# Patient Record
Sex: Female | Born: 1950 | Race: Black or African American | Hispanic: No | Marital: Married | Smoking: Never smoker
Health system: Southern US, Community
[De-identification: ages and names within clinical notes are randomized; demographics above are authoritative.]

## PROBLEM LIST (undated history)

## (undated) DIAGNOSIS — N289 Disorder of kidney and ureter, unspecified: Secondary | ICD-10-CM

## (undated) DIAGNOSIS — E119 Type 2 diabetes mellitus without complications: Secondary | ICD-10-CM

## (undated) DIAGNOSIS — I10 Essential (primary) hypertension: Secondary | ICD-10-CM

## (undated) HISTORY — PX: ABDOMINAL HYSTERECTOMY: SHX81

## (undated) HISTORY — PX: CHOLECYSTECTOMY: SHX55

## (undated) HISTORY — PX: BACK SURGERY: SHX140

---

## 2020-06-30 ENCOUNTER — Emergency Department
Admission: EM | Admit: 2020-06-30 | Discharge: 2020-06-30 | Disposition: A | Discharge status: Home / Self Care | Payer: Medicare HMO | Attending: Student in an Organized Health Care Education/Training Program | Admitting: Student in an Organized Health Care Education/Training Program

## 2020-06-30 ENCOUNTER — Emergency Department: Payer: Medicare HMO

## 2020-06-30 ENCOUNTER — Other Ambulatory Visit: Payer: Self-pay

## 2020-06-30 DIAGNOSIS — E119 Type 2 diabetes mellitus without complications: Secondary | ICD-10-CM | POA: Insufficient documentation

## 2020-06-30 DIAGNOSIS — S60221A Contusion of right hand, initial encounter: Secondary | ICD-10-CM | POA: Diagnosis not present

## 2020-06-30 DIAGNOSIS — M542 Cervicalgia: Secondary | ICD-10-CM | POA: Insufficient documentation

## 2020-06-30 DIAGNOSIS — S6991XA Unspecified injury of right wrist, hand and finger(s), initial encounter: Secondary | ICD-10-CM | POA: Diagnosis present

## 2020-06-30 DIAGNOSIS — S0990XA Unspecified injury of head, initial encounter: Secondary | ICD-10-CM | POA: Insufficient documentation

## 2020-06-30 DIAGNOSIS — I10 Essential (primary) hypertension: Secondary | ICD-10-CM | POA: Insufficient documentation

## 2020-06-30 DIAGNOSIS — Y92410 Unspecified street and highway as the place of occurrence of the external cause: Secondary | ICD-10-CM | POA: Diagnosis not present

## 2020-06-30 HISTORY — DX: Essential (primary) hypertension: I10

## 2020-06-30 HISTORY — DX: Disorder of kidney and ureter, unspecified: N28.9

## 2020-06-30 HISTORY — DX: Type 2 diabetes mellitus without complications: E11.9

## 2020-06-30 MED ORDER — ALPRAZOLAM 0.5 MG PO TABS
0.5000 mg | ORAL_TABLET | Freq: Once | ORAL | Status: AC
  Administered 2020-06-30: 0.5 mg via ORAL
  Filled 2020-06-30: qty 1

## 2020-06-30 MED ORDER — CYCLOBENZAPRINE HCL 5 MG PO TABS: 5.0000 mg | ORAL_TABLET | Freq: Three times a day (TID) | ORAL | 0 refills | Status: DC | PRN

## 2020-06-30 MED ORDER — ACETAMINOPHEN 325 MG PO TABS
650.0000 mg | ORAL_TABLET | Freq: Once | ORAL | Status: AC
  Administered 2020-06-30: 650 mg via ORAL
  Filled 2020-06-30: qty 2
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
## 2020-06-30 NOTE — ED Notes (Signed)
Patient assisted with redressing. Patient ambulatory without difficulty, with a steady gait. Patient walked to her husband's room down the hall. Patient verbalized understanding about discharge instructions.

## 2020-06-30 NOTE — ED Notes (Signed)
Patient transported to X-ray 

## 2020-06-30 NOTE — Discharge Instructions (Signed)
Your exam, CTs, and x-rays are negative and reassuring at this time.  No acute findings on exam.  You did have an incidental finding, of a small thyroid nodule.  This will need to be followed up via your primary care provider with an outpatient ultrasound.  Take over-the-counter Tylenol and Motrin as needed for pain relief.  You may take the prescription muscle relaxant as needed.  Follow-up with your primary provider as discussed.
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
## 2020-06-30 NOTE — ED Notes (Signed)
Patient transported to CT 

## 2020-12-28 DIAGNOSIS — I272 Pulmonary hypertension, unspecified: Secondary | ICD-10-CM | POA: Diagnosis present

## 2022-03-03 ENCOUNTER — Other Ambulatory Visit: Payer: Self-pay

## 2022-03-03 ENCOUNTER — Ambulatory Visit
Admission: RE | Admit: 2022-03-03 | Discharge: 2022-03-03 | Disposition: A | Discharge status: Home / Self Care | Payer: Medicare HMO | Source: Ambulatory Visit | Attending: Internal Medicine | Admitting: Internal Medicine

## 2022-03-03 DIAGNOSIS — R079 Chest pain, unspecified: Secondary | ICD-10-CM

## 2022-10-15 ENCOUNTER — Ambulatory Visit
Admission: RE | Admit: 2022-10-15 | Discharge: 2022-10-15 | Disposition: A | Discharge status: Home / Self Care | Payer: Medicare HMO | Source: Ambulatory Visit | Attending: Nephrology | Admitting: Nephrology

## 2022-10-15 ENCOUNTER — Other Ambulatory Visit: Payer: Self-pay | Admitting: Nephrology

## 2022-10-15 ENCOUNTER — Ambulatory Visit
Admission: RE | Admit: 2022-10-15 | Discharge: 2022-10-15 | Disposition: A | Discharge status: Home / Self Care | Payer: Medicare HMO | Attending: Nephrology | Admitting: Nephrology

## 2022-10-15 DIAGNOSIS — N186 End stage renal disease: Secondary | ICD-10-CM

## 2022-10-15 DIAGNOSIS — R0602 Shortness of breath: Secondary | ICD-10-CM | POA: Diagnosis present

## 2022-10-15 DIAGNOSIS — Z992 Dependence on renal dialysis: Secondary | ICD-10-CM | POA: Diagnosis present

## 2022-10-22 ENCOUNTER — Other Ambulatory Visit (INDEPENDENT_AMBULATORY_CARE_PROVIDER_SITE_OTHER): Payer: Medicare HMO

## 2022-10-22 ENCOUNTER — Encounter (INDEPENDENT_AMBULATORY_CARE_PROVIDER_SITE_OTHER): Payer: Medicare HMO | Admitting: Nurse Practitioner

## 2022-10-22 ENCOUNTER — Encounter (INDEPENDENT_AMBULATORY_CARE_PROVIDER_SITE_OTHER): Payer: Medicare HMO
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
## 2022-11-24 DIAGNOSIS — D631 Anemia in chronic kidney disease: Secondary | ICD-10-CM | POA: Diagnosis present

## 2023-02-03 ENCOUNTER — Encounter: Payer: Self-pay | Admitting: Urology

## 2023-02-03 ENCOUNTER — Ambulatory Visit: Payer: Medicare HMO | Admitting: Urology

## 2023-02-03 VITALS — BP 184/93 | HR 71 | Ht 63.0 in | Wt 202.0 lb

## 2023-02-03 DIAGNOSIS — R319 Hematuria, unspecified: Secondary | ICD-10-CM | POA: Diagnosis not present

## 2023-02-03 LAB — MICROSCOPIC EXAMINATION: WBC, UA: >30 /[HPF] — AB (ref 0–5)

## 2023-02-03 LAB — URINALYSIS, COMPLETE
Bilirubin, UA: NEGATIVE
Ketones, UA: NEGATIVE
Nitrite, UA: NEGATIVE
Specific Gravity, UA: 1.025 (ref 1.005–1.030)
Urobilinogen, Ur: 0.2 mg/dL (ref 0.2–1.0)
pH, UA: 5.5 (ref 5.0–7.5)

## 2023-02-03 LAB — BLADDER SCAN AMB NON-IMAGING: PVR: 14 WU

## 2023-02-03 NOTE — Progress Notes (Signed)
02/03/2023 12:07 AM   [name] [birth year] [id number]  Referring provider: Mosetta Pigeon, MD [address] [facility] [address] [place],  [place] [address]  Chief Complaint  Patient presents with   Hematuria    HPI: 72 year-old female presents today for further evaluation of hematuria and urinary urgency.  She has a personal history of end stage renal disease and recently had a PD catheter placed.   Her urinalysis is frankly positive. It has greater than 30 WBC's, 3-10 RBC's, nitrate negative, many bacteria.  She reports seeing blood in her urine last month, even some clots. She also had burning. Dr. Thedore Mins didn't send a culture. He called her in antibiotics and her symptoms have resolved.   She started dialysis around July 20. She does still make a lot of urine.   She has not had a UTI before. No history of tobacco use.   Results for orders placed or performed in visit on 02/03/23  Microscopic Examination   Urine  Result Value Ref Range   WBC, UA >30 (A) 0 - 5 /hpf   RBC, Urine 3-10 (A) 0 - 2 /hpf   Epithelial Cells (non renal) 0-10 0 - 10 /hpf   Casts Present (A) None seen /lpf   Cast Type Granular casts (A) N/A   Mucus, UA Present (A) Not Estab.   Bacteria, UA Many (A) None seen/Few  Urinalysis, Complete  Result Value Ref Range   Specific Gravity, UA 1.025 1.005 - 1.030   pH, UA 5.5 5.0 - 7.5   Color, UA Yellow Yellow   Appearance Ur Hazy (A) Clear   Leukocytes,UA Trace (A) Negative   Protein,UA 3+ (A) Negative/Trace   Glucose, UA 1+ (A) Negative   Ketones, UA Negative Negative   RBC, UA 2+ (A) Negative   Bilirubin, UA Negative Negative   Urobilinogen, Ur 0.2 0.2 - 1.0 mg/dL   Nitrite, UA Negative Negative   Microscopic Examination See below:   Bladder Scan (Post Void Residual) in office  Result Value Ref Range   PVR 14.0 WU    PMH: Past Medical History:  Diagnosis Date   Diabetes mellitus without complication (HCC)    Hypertension    Renal disorder      Surgical History: Past Surgical History:  Procedure Laterality Date   ABDOMINAL HYSTERECTOMY     BACK SURGERY     CHOLECYSTECTOMY      Home Medications:  Allergies as of 02/03/2023       Reactions   Amlodipine Other (See Comments)   Leg Swelling   Escitalopram Other (See Comments)   weakness Weakness weakness    Weakness   Hydralazine Other (See Comments)   Chest pain   Oxycodone Other (See Comments)   Aspirin    Hydrocodone         Medication List        Accurate as of February 03, 2023 11:59 PM. If you have any questions, ask your nurse or doctor.          albuterol 108 (90 Base) MCG/ACT inhaler Commonly known as: VENTOLIN HFA Inhale into the lungs.   atorvastatin 20 MG tablet Commonly known as: LIPITOR Take by mouth.   Lipitor 10 MG tablet Generic drug: atorvastatin Take by mouth.   BD Pen Needle Nano U/F 32G X 4 MM Misc Generic drug: Insulin Pen Needle Use to inject insulin 4 times daily   BD Pen Needle Nano 2nd Gen 32G X 4 MM Misc Generic drug: Insulin  Pen Needle USE 4 TIMES DAILY AS DIRECTED   carvedilol 25 MG tablet Commonly known as: COREG Take by mouth.   cyclobenzaprine 5 MG tablet Commonly known as: FLEXERIL Take 1 tablet (5 mg total) by mouth 3 (three) times daily as needed.   Dexcom G7 Receiver Hardie Pulley See admin instructions.   Dexcom G7 Sensor Misc Use 1 Device every 10 (ten) days   ergocalciferol 1.25 MG (50000 UT) capsule Commonly known as: VITAMIN D2 Take by mouth.   insulin lispro 100 UNIT/ML KwikPen Commonly known as: HUMALOG Breakfast 8 units,  Dinner 12 units   irbesartan 150 MG tablet Commonly known as: AVAPRO Take 150 mg by mouth daily.   Lantus SoloStar 100 UNIT/ML Solostar Pen Generic drug: insulin glargine Inject into the skin.   omeprazole 20 MG capsule Commonly known as: PRILOSEC Take 20 mg by mouth daily.   pregabalin 25 MG capsule Commonly known as: LYRICA Take 25 mg by mouth 2 (two) times  daily.   spironolactone 25 MG tablet Commonly known as: ALDACTONE Take 25 mg by mouth daily.   torsemide 100 MG tablet Commonly known as: DEMADEX Take 100 mg by mouth daily.        Allergies:  Allergies  Allergen Reactions   Amlodipine Other (See Comments)    Leg Swelling   Escitalopram Other (See Comments)    weakness  Weakness  weakness    Weakness   Hydralazine Other (See Comments)    Chest pain   Oxycodone Other (See Comments)   Aspirin    Hydrocodone     Social History:  reports that she has never smoked. She has never used smokeless tobacco. She reports that she does not currently use alcohol. No history on file for drug use.   Physical Exam: BP (!) 184/93   Pulse 71   Ht 5\' 3"  (1.6 m)   Wt 202 lb (91.6 kg)   BMI 35.78 kg/m   Constitutional:  Alert and oriented, No acute distress. HEENT:  AT, moist mucus membranes.  Trachea midline, no masses. Neurologic: Grossly intact, no focal deficits, moving all 4 extremities. Psychiatric: Normal mood and affect.  Urinalysis    Component Value Date/Time   APPEARANCEUR Hazy (A) 02/03/2023 1510   GLUCOSEU 1+ (A) 02/03/2023 1510   BILIRUBINUR Negative 02/03/2023 1510   PROTEINUR 3+ (A) 02/03/2023 1510   NITRITE Negative 02/03/2023 1510   LEUKOCYTESUR Trace (A) 02/03/2023 1510    Lab Results  Component Value Date   LABMICR See below: 02/03/2023   WBCUA >30 (A) 02/03/2023   LABEPIT 0-10 02/03/2023   MUCUS Present (A) 02/03/2023   BACTERIA Many (A) 02/03/2023     Assessment & Plan:    1. Gross hematuria/ possible acute cystitis  -  It's unclear whether or not this is related to something infectious. Her urine does appear potentially infected today. If there's not infection, she'll need a hematuria workup.  -UCx today, treated as needed -Will reevaluate with a repeat UA in a month.  -If there's still microscopic blood present she has agreed to CT scan and cystoscopy.  Return in about 1 month (around  03/05/2023) for UA.  I have reviewed the above documentation for accuracy and completeness, and I agree with the above.   Vanna Scotland, MD    [facility] [address], [address] [place], [place] [address] [phone]

## 2023-02-03 NOTE — Progress Notes (Incomplete)
02/03/2023 11:57 PM   Helen Gray 15-Jan-1951 [id number]  Referring provider: Mosetta Pigeon, MD [address] [facility] [address] [place],  [place] [address]  Chief Complaint  Patient presents with  . Hematuria    HPI: 72 year-old female presents today for further evaluation of hematuria and urinary urgency.  She has a personal history of end stage renal disease and recently had a PD catheter placed.   Her urinalysis is frankly positive. It has greater than 30 WBC's, 3-10 RBC's, nitrate negative, many bacteria.  She reports seeing blood in her urine last month, even some clots. She also had burning. Dr. Thedore Mins didn't send a culture and called her in antibiotics and her symptoms have resolved.   She started dialysis around July 20. She does still make a lot of urine.   She has not had a UTI before. No history of tobacco use.   Results for orders placed or performed in visit on 02/03/23  Microscopic Examination   Urine  Result Value Ref Range   WBC, UA >30 (A) 0 - 5 /hpf   RBC, Urine 3-10 (A) 0 - 2 /hpf   Epithelial Cells (non renal) 0-10 0 - 10 /hpf   Casts Present (A) None seen /lpf   Cast Type Granular casts (A) N/A   Mucus, UA Present (A) Not Estab.   Bacteria, UA Many (A) None seen/Few  Urinalysis, Complete  Result Value Ref Range   Specific Gravity, UA 1.025 1.005 - 1.030   pH, UA 5.5 5.0 - 7.5   Color, UA Yellow Yellow   Appearance Ur Hazy (A) Clear   Leukocytes,UA Trace (A) Negative   Protein,UA 3+ (A) Negative/Trace   Glucose, UA 1+ (A) Negative   Ketones, UA Negative Negative   RBC, UA 2+ (A) Negative   Bilirubin, UA Negative Negative   Urobilinogen, Ur 0.2 0.2 - 1.0 mg/dL   Nitrite, UA Negative Negative   Microscopic Examination See below:   Bladder Scan (Post Void Residual) in office  Result Value Ref Range   PVR 14.0 WU    PMH: Past Medical History:  Diagnosis Date  . Diabetes mellitus without complication (HCC)   . Hypertension   . Renal disorder      Surgical History: Past Surgical History:  Procedure Laterality Date  . ABDOMINAL HYSTERECTOMY    . BACK SURGERY    . CHOLECYSTECTOMY      Home Medications:  Allergies as of 02/03/2023       Reactions   Amlodipine Other (See Comments)   Leg Swelling   Escitalopram Other (See Comments)   weakness Weakness weakness    Weakness   Hydralazine Other (See Comments)   Chest pain   Oxycodone Other (See Comments)   Aspirin    Hydrocodone         Medication List        Accurate as of February 03, 2023 11:57 PM. If you have any questions, ask your nurse or doctor.          albuterol 108 (90 Base) MCG/ACT inhaler Commonly known as: VENTOLIN HFA Inhale into the lungs.   atorvastatin 20 MG tablet Commonly known as: LIPITOR Take by mouth.   Lipitor 10 MG tablet Generic drug: atorvastatin Take by mouth.   BD Pen Needle Nano U/F 32G X 4 MM Misc Generic drug: Insulin Pen Needle Use to inject insulin 4 times daily   BD Pen Needle Nano 2nd Gen 32G X 4 MM Misc Generic drug: Insulin  Pen Needle USE 4 TIMES DAILY AS DIRECTED   carvedilol 25 MG tablet Commonly known as: COREG Take by mouth.   cyclobenzaprine 5 MG tablet Commonly known as: FLEXERIL Take 1 tablet (5 mg total) by mouth 3 (three) times daily as needed.   Dexcom G7 Receiver Hardie Pulley See admin instructions.   Dexcom G7 Sensor Misc Use 1 Device every 10 (ten) days   ergocalciferol 1.25 MG (50000 UT) capsule Commonly known as: VITAMIN D2 Take by mouth.   insulin lispro 100 UNIT/ML KwikPen Commonly known as: HUMALOG Breakfast 8 units,  Dinner 12 units   irbesartan 150 MG tablet Commonly known as: AVAPRO Take 150 mg by mouth daily.   Lantus SoloStar 100 UNIT/ML Solostar Pen Generic drug: insulin glargine Inject into the skin.   omeprazole 20 MG capsule Commonly known as: PRILOSEC Take 20 mg by mouth daily.   pregabalin 25 MG capsule Commonly known as: LYRICA Take 25 mg by mouth 2 (two) times  daily.   spironolactone 25 MG tablet Commonly known as: ALDACTONE Take 25 mg by mouth daily.   torsemide 100 MG tablet Commonly known as: DEMADEX Take 100 mg by mouth daily.        Allergies:  Allergies  Allergen Reactions  . Amlodipine Other (See Comments)    Leg Swelling  . Escitalopram Other (See Comments)    weakness  Weakness  weakness    Weakness  . Hydralazine Other (See Comments)    Chest pain  . Oxycodone Other (See Comments)  . Aspirin   . Hydrocodone     Social History:  reports that she has never smoked. She has never used smokeless tobacco. She reports that she does not currently use alcohol. No history on file for drug use.   Physical Exam: BP (!) 184/93   Pulse 71   Ht 5\' 3"  (1.6 m)   Wt 202 lb (91.6 kg)   BMI 35.78 kg/m   Constitutional:  Alert and oriented, No acute distress. HEENT: [place] AT, moist mucus membranes.  Trachea midline, no masses. Neurologic: Grossly intact, no focal deficits, moving all 4 extremities. Psychiatric: Normal mood and affect.  Urinalysis    Component Value Date/Time   APPEARANCEUR Hazy (A) 02/03/2023 1510   GLUCOSEU 1+ (A) 02/03/2023 1510   BILIRUBINUR Negative 02/03/2023 1510   PROTEINUR 3+ (A) 02/03/2023 1510   NITRITE Negative 02/03/2023 1510   LEUKOCYTESUR Trace (A) 02/03/2023 1510    Lab Results  Component Value Date   LABMICR See below: 02/03/2023   WBCUA >30 (A) 02/03/2023   LABEPIT 0-10 02/03/2023   MUCUS Present (A) 02/03/2023   BACTERIA Many (A) 02/03/2023     Assessment & Plan:    1. Gross hematuria/ possible acute cystitis  -   No follow-ups on file.  Amy Allegra Grana  [facility] [address], [address] [place], [place] [address] [phone]

## 2023-02-06 ENCOUNTER — Other Ambulatory Visit: Payer: Self-pay

## 2023-02-06 ENCOUNTER — Telehealth: Payer: Self-pay

## 2023-02-06 LAB — CULTURE, URINE COMPREHENSIVE

## 2023-02-06 MED ORDER — CEPHALEXIN 250 MG PO CAPS: 250.0000 mg | ORAL_CAPSULE | Freq: Every day | ORAL | 0 refills | Status: AC

## 2023-02-06 NOTE — Telephone Encounter (Signed)
Pt is calling requesting u/c results.   Pls advise.

## 2023-02-06 NOTE — Telephone Encounter (Signed)
LVM for pt to return call, to inform them of antibiotic sent.

## 2023-02-06 NOTE — Telephone Encounter (Signed)
Please send in Keflex 250 mg daily x 5 days.

## 2023-02-09 ENCOUNTER — Other Ambulatory Visit: Payer: Self-pay

## 2023-02-09 MED ORDER — AMOXICILLIN-POT CLAVULANATE 250-125 MG PO TABS: 1.0000 | ORAL_TABLET | Freq: Two times a day (BID) | ORAL | 0 refills | Status: DC

## 2023-02-09 NOTE — Telephone Encounter (Signed)
Pt informed, voiced understanding

## 2023-02-09 NOTE — Telephone Encounter (Signed)
Pt would like to know what type of infection she has.  I read message about abx being sent in.

## 2023-03-03 ENCOUNTER — Encounter: Payer: Self-pay | Admitting: Physician Assistant

## 2023-03-03 ENCOUNTER — Ambulatory Visit: Payer: Medicare HMO | Admitting: Physician Assistant

## 2023-03-03 VITALS — BP 176/90 | HR 76

## 2023-03-03 DIAGNOSIS — R3121 Asymptomatic microscopic hematuria: Secondary | ICD-10-CM

## 2023-03-03 LAB — MICROSCOPIC EXAMINATION: WBC, UA: >30 /[HPF] — AB (ref 0–5)

## 2023-03-03 LAB — URINALYSIS, COMPLETE
Bilirubin, UA: NEGATIVE
Ketones, UA: NEGATIVE
Nitrite, UA: NEGATIVE
Specific Gravity, UA: 1.025 (ref 1.005–1.030)
Urobilinogen, Ur: 0.2 mg/dL (ref 0.2–1.0)
pH, UA: 6 (ref 5.0–7.5)

## 2023-03-03 NOTE — Progress Notes (Signed)
03/03/2023 2:40 PM   Helen Gray 1950/10/02 [id number]  CC: Chief Complaint  Patient presents with   Follow-up   HPI: Helen Gray is a 72 y.o. female with PMH ESRD on PD with a recent history of gross hematuria possibly related to UTI who presents today for urine recheck.  She is accompanied today by her husband, who contributes to HPI.   Today she reports her gross hematuria resolved after antibiotics per Dr. Thedore Gray.  We treated her with 5 days of Keflex for Klebsiella UTI after her visit with Dr. Apolinar Gray last month.  She denies dysuria but reports some occasional urgency.  In-office UA today positive for 1+ glucose, 1+ blood, 3+ protein, and trace leukocytes; urine microscopy with >30 WBCs/HPF, 3-10 RBCs/HPF, and many bacteria.  PMH: Past Medical History:  Diagnosis Date   Diabetes mellitus without complication (HCC)    Hypertension    Renal disorder     Surgical History: Past Surgical History:  Procedure Laterality Date   ABDOMINAL HYSTERECTOMY     BACK SURGERY     CHOLECYSTECTOMY      Home Medications:  Allergies as of 03/03/2023       Reactions   Amlodipine Other (See Comments)   Leg Swelling   Escitalopram Other (See Comments)   weakness Weakness weakness    Weakness   Gabapentin Other (See Comments)   headache   Hydralazine Other (See Comments)   Chest pain   Oxycodone Other (See Comments)   Aspirin    Codeine    Hydrocodone         Medication List        Accurate as of March 03, 2023  2:40 PM. If you have any questions, ask your nurse or doctor.          albuterol 108 (90 Base) MCG/ACT inhaler Commonly known as: VENTOLIN HFA Inhale into the lungs.   amoxicillin-clavulanate 250-125 MG tablet Commonly known as: AUGMENTIN Take 1 tablet by mouth 2 (two) times daily.   atorvastatin 20 MG tablet Commonly known as: LIPITOR Take by mouth.   Lipitor 10 MG tablet Generic drug: atorvastatin Take by mouth.   BD Pen Needle Nano U/F 32G X 4  MM Misc Generic drug: Insulin Pen Needle Use to inject insulin 4 times daily   BD Pen Needle Nano 2nd Gen 32G X 4 MM Misc Generic drug: Insulin Pen Needle USE 4 TIMES DAILY AS DIRECTED   carvedilol 25 MG tablet Commonly known as: COREG Take by mouth.   cyclobenzaprine 5 MG tablet Commonly known as: FLEXERIL Take 1 tablet (5 mg total) by mouth 3 (three) times daily as needed.   Dexcom G7 Receiver Helen Gray See admin instructions.   Dexcom G7 Sensor Misc Use 1 Device every 10 (ten) days   ergocalciferol 1.25 MG (50000 UT) capsule Commonly known as: VITAMIN D2 Take by mouth.   insulin lispro 100 UNIT/ML KwikPen Commonly known as: HUMALOG Breakfast 8 units,  Dinner 12 units   irbesartan 150 MG tablet Commonly known as: AVAPRO Take 150 mg by mouth daily.   Lantus SoloStar 100 UNIT/ML Solostar Pen Generic drug: insulin glargine Inject into the skin.   multivitamin tablet Take 1 tablet by mouth daily.   omeprazole 20 MG capsule Commonly known as: PRILOSEC Take 20 mg by mouth daily.   potassium chloride SA 20 MEQ tablet Commonly known as: KLOR-CON M Take 20 mEq by mouth 2 (two) times daily.   pregabalin 25 MG capsule Commonly known as:  LYRICA Take 25 mg by mouth 2 (two) times daily.   spironolactone 25 MG tablet Commonly known as: ALDACTONE Take 25 mg by mouth daily.   torsemide 100 MG tablet Commonly known as: DEMADEX Take 100 mg by mouth daily.        Allergies:  Allergies  Allergen Reactions   Amlodipine Other (See Comments)    Leg Swelling   Escitalopram Other (See Comments)    weakness  Weakness  weakness    Weakness   Gabapentin Other (See Comments)    headache   Hydralazine Other (See Comments)    Chest pain   Oxycodone Other (See Comments)   Aspirin    Codeine    Hydrocodone     Family History: No family history on file.  Social History:   reports that she has never smoked. She has never used smokeless tobacco. She reports that  she does not currently use alcohol. No history on file for drug use.  Physical Exam: BP (!) 176/90   Pulse 76   Constitutional:  Alert and oriented, no acute distress, nontoxic appearing HEENT: Helen Gray, AT Cardiovascular: No clubbing, cyanosis, or edema Respiratory: Normal respiratory effort, no increased work of breathing Skin: No rashes, bruises or suspicious lesions Neurologic: Grossly intact, no focal deficits, moving all 4 extremities Psychiatric: Normal mood and affect  Laboratory Data: Results for orders placed or performed in visit on 03/03/23  Microscopic Examination   Urine  Result Value Ref Range   WBC, UA >30 (A) 0 - 5 /hpf   RBC, Urine 3-10 (A) 0 - 2 /hpf   Epithelial Cells (non renal) 0-10 0 - 10 /hpf   Bacteria, UA Many (A) None seen/Few  Urinalysis, Complete  Result Value Ref Range   Specific Gravity, UA 1.025 1.005 - 1.030   pH, UA 6.0 5.0 - 7.5   Color, UA Yellow Yellow   Appearance Ur Hazy (A) Clear   Leukocytes,UA Trace (A) Negative   Protein,UA 3+ (A) Negative/Trace   Glucose, UA 1+ (A) Negative   Ketones, UA Negative Negative   RBC, UA 1+ (A) Negative   Bilirubin, UA Negative Negative   Urobilinogen, Ur 0.2 0.2 - 1.0 mg/dL   Nitrite, UA Negative Negative   Microscopic Examination See below:    Assessment & Plan:   1. 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  That said, we will pursue hematuria workup in light of her ongoing microscopic hematuria.  We discussed with the micro heme may be related to colonization, however important to pursue CT scan and cystoscopy to rule out other malignant causes.  Will obtain CT stone study due to ESRD.  She is in agreement with this plan.  Repeat urine culture today but defer antibiotics given that she is asymptomatic.  She will require antibiotics leading up to cystoscopy next month. - Urinalysis,  Complete - CULTURE, URINE COMPREHENSIVE - CT RENAL STONE STUDY; Future  Return in about 4 weeks (around 03/31/2023) for Cysto and CT results with Dr. Apolinar Gray.  Helen Ching, PA-C  [facility] [place] [address] [place], [place] [address] [phone]

## 2023-03-03 NOTE — Patient Instructions (Signed)

## 2023-03-06 LAB — CULTURE, URINE COMPREHENSIVE

## 2023-03-17 ENCOUNTER — Ambulatory Visit
Admission: RE | Admit: 2023-03-17 | Discharge: 2023-03-17 | Disposition: A | Discharge status: Home / Self Care | Payer: Medicare HMO | Source: Ambulatory Visit | Attending: Physician Assistant | Admitting: Physician Assistant

## 2023-03-17 DIAGNOSIS — R3121 Asymptomatic microscopic hematuria: Secondary | ICD-10-CM | POA: Insufficient documentation

## 2023-04-09 ENCOUNTER — Telehealth: Payer: Self-pay

## 2023-04-09 MED ORDER — CEFUROXIME AXETIL 250 MG PO TABS: 250.0000 mg | ORAL_TABLET | Freq: Two times a day (BID) | ORAL | 0 refills | Status: DC

## 2023-04-09 NOTE — Telephone Encounter (Signed)
-----   Message from [facility] sent at 04/09/2023  5:07 PM EST ----- Please have her start cefuroxime 250mg  BID x5 days 3 days prior to upcoming cystoscopy to prevent UTI.

## 2023-04-21 ENCOUNTER — Other Ambulatory Visit: Payer: Medicare HMO | Admitting: Urology

## 2023-04-22 ENCOUNTER — Telehealth: Payer: Self-pay | Admitting: Urology

## 2023-04-22 NOTE — Telephone Encounter (Signed)
No other openings right now, will let Dr Apolinar Junes review for recommendations

## 2023-04-22 NOTE — Telephone Encounter (Signed)
Patient called wanting to see if she can be seen before 2/18 for her cysto. She states that her urine has a blood smell to it and when she stands for long periods of time( I.e. when she is washing dishes) her left side starts hurting, also sometimes she gets sharp pains that happen in her lower left abdomen. She would like a call back to discuss what she needs to do?

## 2023-04-23 NOTE — Telephone Encounter (Signed)
Patient advised.

## 2023-04-23 NOTE — Telephone Encounter (Signed)
CT scan is normal in terms of her genitourinary tract.  I do not think any thing that can explain the left-sided pain in the lower abdomen.  This could be GI or musculoskeletal.  I would follow-up with PCP.    In terms of cystoscopy, we can certainly add her to a waiting list but this, while very important, will not lnecessarily give her an answer to her specific question.  Vanna Scotland, MD

## 2023-05-19 ENCOUNTER — Ambulatory Visit: Payer: Medicare HMO | Admitting: Urology

## 2023-05-19 VITALS — BP 183/82 | HR 80

## 2023-05-19 DIAGNOSIS — R310 Gross hematuria: Secondary | ICD-10-CM

## 2023-05-19 DIAGNOSIS — R350 Frequency of micturition: Secondary | ICD-10-CM

## 2023-05-19 LAB — URINALYSIS, COMPLETE
Bilirubin, UA: NEGATIVE
Glucose, UA: NEGATIVE
Ketones, UA: NEGATIVE
Leukocytes,UA: NEGATIVE
Nitrite, UA: NEGATIVE
RBC, UA: NEGATIVE
Specific Gravity, UA: <1.005 — ABNORMAL LOW (ref 1.005–1.030)
Urobilinogen, Ur: 0.2 mg/dL (ref 0.2–1.0)
pH, UA: 5.5 (ref 5.0–7.5)

## 2023-05-19 LAB — MICROSCOPIC EXAMINATION

## 2023-05-19 NOTE — Progress Notes (Signed)
   05/19/23  CC:  Chief Complaint  Patient presents with   Cysto    HPI: Female with a personal history of gross hematuria related to UTI who presents today for cystoscopy.  Patient presents for details.  Today, her urine is unremarkable other than for procedure  She does complain of urgency frequency and urge incontinence.  She does take 100 mg of Lasix every day.  NED. A&Ox3.   No respiratory distress   Abd soft, NT, ND Normal external genitalia with patent urethral meatus  Cystoscopy Procedure Note  Patient identification was confirmed, informed consent was obtained, and patient was prepped using Betadine solution.  Lidocaine jelly was administered per urethral meatus.    Procedure: - Flexible cystoscope introduced, without any difficulty.   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    no stones    no ulcers     no tumors    no urethral polyps    no trabeculation  - Ureteral orifices were normal in position and appearance.  Post-Procedure: - Patient tolerated the procedure well  Assessment/ Plan:  1. Gross hematuria (Primary) Likely related to previous infectious process  Some mild erythematous changes today at most consistent with inflammatory related change i.e. cystitis cystica  Not enough urine today for cytology but will have her follow-up for this to ensure that there is no underlying pathology, low suspicion - Urinalysis, Complete  2. Urinary frequency Bothersome exacerbated by Lasix  May benefit from anticholinergic versus beta 3 agonist.  Will have her follow-up to discuss this further. - Urinalysis, Complete    Follow-up to discuss urinary frequency/urge incontinence and urine cytology  Vanna Scotland, MD

## 2023-06-03 ENCOUNTER — Encounter: Payer: Self-pay | Admitting: Physician Assistant

## 2023-06-03 ENCOUNTER — Ambulatory Visit: Payer: Medicare HMO | Admitting: Physician Assistant

## 2023-06-03 VITALS — BP 131/68 | HR 82 | Resp 16 | Ht 63.0 in | Wt 201.8 lb

## 2023-06-03 DIAGNOSIS — R310 Gross hematuria: Secondary | ICD-10-CM

## 2023-06-03 DIAGNOSIS — R350 Frequency of micturition: Secondary | ICD-10-CM

## 2023-06-03 NOTE — Progress Notes (Signed)
 06/03/2023 1:59 PM   [name] 07/21/1950 [id number]  CC: Chief Complaint  Patient presents with   Abdominal Pain   urine frequency   Dysuria   HPI: Helen Gray is a 73 y.o. 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  She is accompanied today by her husband.  Today she reports she voided just prior to her appointment today and is unable to provide a urine specimen.  She has been having urgency and urge incontinence since her cystoscopy a couple of weeks ago.  PMH: Past Medical History:  Diagnosis Date   Diabetes mellitus without complication (HCC)    Hypertension    Renal disorder     Surgical History: Past Surgical History:  Procedure Laterality Date   ABDOMINAL HYSTERECTOMY     BACK SURGERY     CHOLECYSTECTOMY      Home Medications:  Allergies as of 06/03/2023       Reactions   Amlodipine Other (See Comments)   Leg Swelling   Escitalopram Other (See Comments)   weakness Weakness weakness    Weakness   Gabapentin Other (See Comments)   headache   Hydralazine Other (See Comments)   Chest pain   Oxycodone Other (See Comments)   Aspirin    Codeine    Hydrocodone         Medication List        Accurate as of June 03, 2023  1:59 PM. If you have any questions, ask your nurse or doctor.          albuterol 108 (90 Base) MCG/ACT inhaler Commonly known as: VENTOLIN HFA Inhale into the lungs.   amoxicillin-clavulanate 250-125 MG tablet Commonly known as: AUGMENTIN Take 1 tablet by mouth 2 (two) times daily.   atorvastatin 20 MG tablet Commonly known as: LIPITOR Take by mouth.   Lipitor 10 MG tablet Generic drug: atorvastatin Take by mouth.   BD Pen Needle Nano U/F 32G X 4 MM Misc Generic drug: Insulin Pen Needle Use to inject insulin 4 times daily   BD Pen Needle Nano 2nd Gen 32G X 4 MM  Misc Generic drug: Insulin Pen Needle USE 4 TIMES DAILY AS DIRECTED   carvedilol 25 MG tablet Commonly known as: COREG Take by mouth.   cefUROXime 250 MG tablet Commonly known as: CEFTIN Take 1 tablet (250 mg total) by mouth 2 (two) times daily with a meal. Please start 3 days prior to upcoming cystoscopy appointment to prevent UTI.   cyclobenzaprine 5 MG tablet Commonly known as: FLEXERIL Take 1 tablet (5 mg total) by mouth 3 (three) times daily as needed.   Dexcom G7 Receiver Hardie Pulley See admin instructions.   Dexcom G7 Sensor Misc Use 1 Device every 10 (ten) days   ergocalciferol 1.25 MG (50000 UT) capsule Commonly known as: VITAMIN D2 Take by mouth.   escitalopram 10 MG tablet Commonly known as: LEXAPRO Take 10 mg by mouth daily.   insulin lispro 100 UNIT/ML KwikPen Commonly known as: HUMALOG Breakfast 8 units,  Dinner 12 units   irbesartan 150 MG tablet Commonly known as: AVAPRO Take 150 mg by mouth daily.   Lantus SoloStar 100 UNIT/ML Solostar Pen Generic drug: insulin glargine Inject into the skin.   magnesium 30 MG tablet Take 30 mg by mouth 2 (two) times daily.   multivitamin tablet Take 1 tablet  by mouth daily.   omeprazole 20 MG capsule Commonly known as: PRILOSEC Take 20 mg by mouth daily.   potassium chloride SA 20 MEQ tablet Commonly known as: KLOR-CON M Take 20 mEq by mouth 2 (two) times daily.   pregabalin 25 MG capsule Commonly known as: LYRICA Take 25 mg by mouth 2 (two) times daily.   spironolactone 25 MG tablet Commonly known as: ALDACTONE Take 25 mg by mouth daily.   torsemide 100 MG tablet Commonly known as: DEMADEX Take 100 mg by mouth daily.        Allergies:  Allergies  Allergen Reactions   Amlodipine Other (See Comments)    Leg Swelling   Escitalopram Other (See Comments)    weakness  Weakness  weakness    Weakness   Gabapentin Other (See Comments)    headache   Hydralazine Other (See Comments)    Chest pain    Oxycodone Other (See Comments)   Aspirin    Codeine    Hydrocodone     Family History: No family history on file.  Social History:   reports that she has never smoked. She has never used smokeless tobacco. She reports that she does not currently use alcohol. No history on file for drug use.  Physical Exam: BP 131/68   Pulse 82   Resp 16   Ht 5\' 3"  (1.6 m)   Wt 201 lb 12.8 oz (91.5 kg)   BMI 35.75 kg/m   Constitutional:  Alert and oriented, no acute distress, nontoxic appearing HEENT: [place], AT Cardiovascular: No clubbing, cyanosis, or edema Respiratory: Normal respiratory effort, no increased work of breathing Skin: No rashes, bruises or suspicious lesions Neurologic: Grossly intact, no focal deficits, moving all 4 extremities Psychiatric: Normal mood and affect  In and Out Catheterization  Patient is present today for a I & O catheterization due to hematuria. Patient was cleaned and prepped in a sterile fashion with betadine . A 14FR cath was inserted no complications were noted , <101ml of urine return was noted, urine was yellow in color. Catheter was removed with out difficulty.    Performed by: Carman Ching, PA-C   Assessment & Plan:   1. Gross hematuria (Primary) Unable to void a specimen today.  I catheter but was unable to collect sufficient urine for planned cytology.  I gave her a urine cup to take home and asked her to drop off a urine specimen at her convenience.  Will plan for UA and cytology. - Urinalysis, Complete; Future   2. Urinary frequency Urgency, frequency, and urge incontinence since cystoscopy.  Need to rule out infection per UA as above.  Once ruled out, okay to start a trial of beta 3 agonists.  Return for Will call with results.  Carman Ching, PA-C  [facility] [place] [address] [place], [place] [address] [phone]

## 2023-06-04 ENCOUNTER — Other Ambulatory Visit: Payer: Self-pay

## 2023-06-04 DIAGNOSIS — R310 Gross hematuria: Secondary | ICD-10-CM

## 2023-06-04 LAB — MICROSCOPIC EXAMINATION: WBC, UA: >30 /HPF — AB (ref 0–5)

## 2023-06-04 LAB — URINALYSIS, COMPLETE
Bilirubin, UA: NEGATIVE
Glucose, UA: NEGATIVE
Ketones, UA: NEGATIVE
Nitrite, UA: NEGATIVE
Specific Gravity, UA: 1.025 (ref 1.005–1.030)
Urobilinogen, Ur: 0.2 mg/dL (ref 0.2–1.0)
pH, UA: 6.5 (ref 5.0–7.5)

## 2023-06-12 ENCOUNTER — Ambulatory Visit (INDEPENDENT_AMBULATORY_CARE_PROVIDER_SITE_OTHER): Admitting: Physician Assistant

## 2023-06-12 ENCOUNTER — Encounter: Payer: Self-pay | Admitting: Physician Assistant

## 2023-06-12 VITALS — BP 132/78 | HR 76 | Ht 64.0 in | Wt 200.4 lb

## 2023-06-12 DIAGNOSIS — N3000 Acute cystitis without hematuria: Secondary | ICD-10-CM | POA: Diagnosis not present

## 2023-06-12 MED ORDER — TRIMETHOPRIM 100 MG PO TABS: 100.0000 mg | ORAL_TABLET | Freq: Every day | ORAL | 0 refills | Status: DC

## 2023-06-12 MED ORDER — CEPHALEXIN 250 MG PO CAPS: 250.0000 mg | ORAL_CAPSULE | Freq: Every day | ORAL | 0 refills | Status: AC

## 2023-06-12 NOTE — Progress Notes (Signed)
 06/12/2023 4:56 PM   [name] Jul 18, 1950 [id number]  CC: Chief Complaint  Patient presents with   Follow-up   HPI: Helen Gray is a 73 y.o. female with PMH ESRD on PD and a recent history of gross hematuria possibly related to UTI with recent findings of inflammatory changes suggesting cystitis cystica who presents today for follow-up of urine cytology.  She is accompanied today by her husband.   Today she reports persistent dysuria, urgency, and frequency, as well as intermittent malodorous urine.  Her urine cytology was benign.  UA appeared positive with pyuria and bacteriuria, however unfortunately a culture was not sent.  PMH: Past Medical History:  Diagnosis Date   Diabetes mellitus without complication (HCC)    Hypertension    Renal disorder     Surgical History: Past Surgical History:  Procedure Laterality Date   ABDOMINAL HYSTERECTOMY     BACK SURGERY     CHOLECYSTECTOMY      Home Medications:  Allergies as of 06/12/2023       Reactions   Amlodipine Other (See Comments)   Leg Swelling   Escitalopram Other (See Comments)   weakness Weakness weakness    Weakness   Gabapentin Other (See Comments)   headache   Hydralazine Other (See Comments)   Chest pain   Oxycodone Other (See Comments)   Aspirin    Codeine    Hydrocodone         Medication List        Accurate as of June 12, 2023  4:56 PM. If you have any questions, ask your nurse or doctor.          STOP taking these medications    amoxicillin-clavulanate 250-125 MG tablet Commonly known as: AUGMENTIN Stopped by: Carman Ching   cefUROXime 250 MG tablet Commonly known as: CEFTIN Stopped by: Carman Ching       TAKE these medications    albuterol 108 (90 Base) MCG/ACT inhaler Commonly known as: VENTOLIN HFA Inhale into the lungs.   atorvastatin 20 MG tablet Commonly known as: LIPITOR Take by mouth.   Lipitor 10 MG tablet Generic drug:  atorvastatin Take by mouth.   BD Pen Needle Nano U/F 32G X 4 MM Misc Generic drug: Insulin Pen Needle Use to inject insulin 4 times daily   BD Pen Needle Nano 2nd Gen 32G X 4 MM Misc Generic drug: Insulin Pen Needle USE 4 TIMES DAILY AS DIRECTED   carvedilol 25 MG tablet Commonly known as: COREG Take by mouth.   cephALEXin 250 MG capsule Commonly known as: KEFLEX Take 1 capsule (250 mg total) by mouth daily for 5 days. Started by: Carman Ching   cyclobenzaprine 5 MG tablet Commonly known as: FLEXERIL Take 1 tablet (5 mg total) by mouth 3 (three) times daily as needed.   Dexcom G7 Receiver Hardie Pulley See admin instructions.   Dexcom G7 Sensor Misc Use 1 Device every 10 (ten) days   ergocalciferol 1.25 MG (50000 UT) capsule Commonly known as: VITAMIN D2 Take by mouth.   escitalopram 10 MG tablet Commonly known as: LEXAPRO Take 10 mg by mouth daily.   insulin lispro 100 UNIT/ML KwikPen Commonly known as: HUMALOG Breakfast 8 units,  Dinner 12 units   irbesartan 150 MG tablet Commonly known as: AVAPRO Take 150 mg by mouth daily.   Lantus SoloStar 100 UNIT/ML Solostar Pen Generic drug: insulin glargine Inject into the skin.   magnesium 30 MG tablet Take 30 mg by mouth 2 (two)  times daily.   multivitamin tablet Take 1 tablet by mouth daily.   omeprazole 20 MG capsule Commonly known as: PRILOSEC Take 20 mg by mouth daily.   potassium chloride SA 20 MEQ tablet Commonly known as: KLOR-CON M Take 20 mEq by mouth 2 (two) times daily.   pregabalin 25 MG capsule Commonly known as: LYRICA Take 25 mg by mouth 2 (two) times daily.   spironolactone 25 MG tablet Commonly known as: ALDACTONE Take 25 mg by mouth daily.   torsemide 100 MG tablet Commonly known as: DEMADEX Take 100 mg by mouth daily.   trimethoprim 100 MG tablet Commonly known as: TRIMPEX Take 1 tablet (100 mg total) by mouth daily. Started by: Carman Ching        Allergies:   Allergies  Allergen Reactions   Amlodipine Other (See Comments)    Leg Swelling   Escitalopram Other (See Comments)    weakness  Weakness  weakness    Weakness   Gabapentin Other (See Comments)    headache   Hydralazine Other (See Comments)    Chest pain   Oxycodone Other (See Comments)   Aspirin    Codeine    Hydrocodone     Family History: No family history on file.  Social History:   reports that she has never smoked. She has never used smokeless tobacco. She reports that she does not currently use alcohol. No history on file for drug use.  Physical Exam: BP 132/78   Pulse 76   Ht 5\' 4"  (1.626 m)   Wt 200 lb 6.4 oz (90.9 kg)   BMI 34.40 kg/m   Constitutional:  Alert and oriented, no acute distress, nontoxic appearing HEENT: [place], AT Cardiovascular: No clubbing, cyanosis, or edema Respiratory: Normal respiratory effort, no increased work of breathing Skin: No rashes, bruises or suspicious lesions Neurologic: Grossly intact, no focal deficits, moving all 4 extremities Psychiatric: Normal mood and affect  Assessment & Plan:   1. Acute cystitis without hematuria (Primary) Benign urine cytology results shared with patient.  Given ongoing irritative voiding symptoms, I would like to treat her empirically with Keflex and then have her take 3 months of suppressive trimethoprim to down regulate her bladder for her chronic cystitis picture.  I will see her back in clinic in 3 months for symptom recheck.  May also consider beta 3 agonist in the future for persistent urgency/frequency without burning. - trimethoprim (TRIMPEX) 100 MG tablet; Take 1 tablet (100 mg total) by mouth daily.  Dispense: 90 tablet; Refill: 0 - cephALEXin (KEFLEX) 250 MG capsule; Take 1 capsule (250 mg total) by mouth daily for 5 days.  Dispense: 5 capsule; Refill: 0  Return in about 3 months (around 09/12/2023) for Symptom recheck and UA.  Carman Ching, PA-C  [facility]  [place] [address] [place], [place] [address] [phone]

## 2023-06-12 NOTE — Patient Instructions (Signed)
 Start Keflex (cephalexin) once daily for 5 days to treat your UTI. The day after you complete Keflex, start trimethoprim once daily to prevent UTIs.

## 2023-07-06 ENCOUNTER — Ambulatory Visit: Admitting: Sleep Medicine

## 2023-08-12 ENCOUNTER — Ambulatory Visit: Admitting: Physician Assistant

## 2023-08-18 ENCOUNTER — Encounter (INDEPENDENT_AMBULATORY_CARE_PROVIDER_SITE_OTHER): Payer: Self-pay

## 2023-08-26 ENCOUNTER — Ambulatory Visit: Admitting: Physician Assistant

## 2023-08-26 VITALS — BP 112/69 | HR 69 | Ht 64.0 in | Wt 202.0 lb

## 2023-08-26 DIAGNOSIS — R350 Frequency of micturition: Secondary | ICD-10-CM | POA: Diagnosis not present

## 2023-08-26 NOTE — Progress Notes (Signed)
 08/26/2023 1:52 PM   Helen Gray [birth year] [id number]  CC: Chief Complaint  Patient presents with   Hematuria   HPI: Helen Gray is a 73 y.o. female with PMH ESRD on PD, and recent history of gross hematuria possibly related to UTI, and cystitis cystica who presents today for follow-up on daily trimethoprim .   Today she reports she is doing better overall on daily trimethoprim .  She describes resolution of her malodorous urine and dysuria.  She was having some urinary frequency every 2-3 hours, however this is improved since her torsemide dose was changed.  She describes nocturia x 1.  She is unable to provide a urine specimen today.  PMH: Past Medical History:  Diagnosis Date   Diabetes mellitus without complication (HCC)    Hypertension    Renal disorder     Surgical History: Past Surgical History:  Procedure Laterality Date   ABDOMINAL HYSTERECTOMY     BACK SURGERY     CHOLECYSTECTOMY      Home Medications:  Allergies as of 08/26/2023       Reactions   Amlodipine Other (See Comments)   Leg Swelling   Escitalopram Other (See Comments)   weakness Weakness weakness    Weakness   Gabapentin Other (See Comments)   headache   Hydralazine Other (See Comments)   Chest pain   Oxycodone Other (See Comments)   Aspirin    Codeine    Hydrocodone         Medication List        Accurate as of Aug 26, 2023  1:52 PM. If you have any questions, ask your nurse or doctor.          albuterol 108 (90 Base) MCG/ACT inhaler Commonly known as: VENTOLIN HFA Inhale into the lungs.   atorvastatin 20 MG tablet Commonly known as: LIPITOR Take by mouth.   Lipitor 10 MG tablet Generic drug: atorvastatin Take by mouth.   BD Pen Needle Nano U/F 32G X 4 MM Misc Generic drug: Insulin Pen Needle Use to inject insulin 4 times daily   BD Pen Needle Nano 2nd Gen 32G X 4 MM Misc Generic drug: Insulin Pen Needle USE 4 TIMES DAILY AS DIRECTED   carvedilol 25 MG  tablet Commonly known as: COREG Take by mouth.   cyclobenzaprine  5 MG tablet Commonly known as: FLEXERIL  Take 1 tablet (5 mg total) by mouth 3 (three) times daily as needed.   Dexcom G7 Receiver Seymour Dapper See admin instructions.   Dexcom G7 Sensor Misc Use 1 Device every 10 (ten) days   ergocalciferol 1.25 MG (50000 UT) capsule Commonly known as: VITAMIN D2 Take by mouth.   escitalopram 10 MG tablet Commonly known as: LEXAPRO Take 10 mg by mouth daily.   insulin lispro 100 UNIT/ML KwikPen Commonly known as: HUMALOG Breakfast 8 units,  Dinner 12 units   irbesartan 150 MG tablet Commonly known as: AVAPRO Take 150 mg by mouth daily.   Lantus SoloStar 100 UNIT/ML Solostar Pen Generic drug: insulin glargine Inject into the skin.   magnesium 30 MG tablet Take 30 mg by mouth 2 (two) times daily.   multivitamin tablet Take 1 tablet by mouth daily.   omeprazole 20 MG capsule Commonly known as: PRILOSEC Take 20 mg by mouth daily.   potassium chloride SA 20 MEQ tablet Commonly known as: KLOR-CON M Take 20 mEq by mouth 2 (two) times daily.   pregabalin 25 MG capsule Commonly known as: LYRICA Take 25 mg by  mouth 2 (two) times daily.   spironolactone 25 MG tablet Commonly known as: ALDACTONE Take 25 mg by mouth daily.   torsemide 100 MG tablet Commonly known as: DEMADEX Take 100 mg by mouth daily.   trimethoprim  100 MG tablet Commonly known as: TRIMPEX  Take 1 tablet (100 mg total) by mouth daily.        Allergies:  Allergies  Allergen Reactions   Amlodipine Other (See Comments)    Leg Swelling   Escitalopram Other (See Comments)    weakness  Weakness  weakness    Weakness   Gabapentin Other (See Comments)    headache   Hydralazine Other (See Comments)    Chest pain   Oxycodone Other (See Comments)   Aspirin    Codeine    Hydrocodone     Family History: No family history on file.  Social History:   reports that she has never smoked. She has  never used smokeless tobacco. She reports that she does not currently use alcohol. No history on file for drug use.  Physical Exam: BP 112/69   Pulse 69   Ht 5\' 4"  (1.626 m)   Wt 202 lb (91.6 kg)   BMI 34.67 kg/m   Constitutional:  Alert and oriented, no acute distress, nontoxic appearing HEENT: [place], AT Cardiovascular: No clubbing, cyanosis, or edema Respiratory: Normal respiratory effort, no increased work of breathing Skin: No rashes, bruises or suspicious lesions Neurologic: Grossly intact, no focal deficits, moving all 4 extremities Psychiatric: Normal mood and affect  Assessment & Plan:   1. Urinary frequency (Primary) Improved with adjusted torsemide dose.  Irritative voiding symptoms improved on daily trimethoprim .  I counseled her to complete the 90-day course as prescribed, anticipated 1 month remaining, and follow-up with me as needed.  If her symptoms quickly recur after stopping trimethoprim , we can put her back on it.  Return if symptoms worsen or fail to improve.  Kathreen Pare, PA-C  [facility] [place] [address], [address] [place], [place] [address] [phone]

## 2023-08-27 ENCOUNTER — Other Ambulatory Visit: Payer: Self-pay | Admitting: Family Medicine

## 2023-08-27 DIAGNOSIS — Z0000 Encounter for general adult medical examination without abnormal findings: Secondary | ICD-10-CM

## 2023-08-27 DIAGNOSIS — Z1231 Encounter for screening mammogram for malignant neoplasm of breast: Secondary | ICD-10-CM

## 2023-10-26 ENCOUNTER — Encounter: Payer: Self-pay | Admitting: Family Medicine

## 2023-10-26 ENCOUNTER — Encounter

## 2023-10-26 ENCOUNTER — Other Ambulatory Visit

## 2023-10-29 ENCOUNTER — Other Ambulatory Visit: Payer: Self-pay | Admitting: Family Medicine

## 2023-10-29 ENCOUNTER — Encounter

## 2023-10-29 ENCOUNTER — Ambulatory Visit
Admission: RE | Admit: 2023-10-29 | Discharge: 2023-10-29 | Disposition: A | Discharge status: Home / Self Care | Attending: Family Medicine | Admitting: Family Medicine

## 2023-10-29 ENCOUNTER — Other Ambulatory Visit

## 2023-10-29 ENCOUNTER — Ambulatory Visit
Admission: RE | Admit: 2023-10-29 | Discharge: 2023-10-29 | Disposition: A | Discharge status: Home / Self Care | Source: Ambulatory Visit | Attending: Family Medicine | Admitting: Family Medicine

## 2023-10-29 DIAGNOSIS — J4541 Moderate persistent asthma with (acute) exacerbation: Secondary | ICD-10-CM | POA: Insufficient documentation

## 2023-10-30 ENCOUNTER — Encounter: Payer: Self-pay | Admitting: Emergency Medicine

## 2023-10-30 ENCOUNTER — Emergency Department

## 2023-10-30 ENCOUNTER — Other Ambulatory Visit: Payer: Self-pay

## 2023-10-30 DIAGNOSIS — E1122 Type 2 diabetes mellitus with diabetic chronic kidney disease: Secondary | ICD-10-CM | POA: Insufficient documentation

## 2023-10-30 DIAGNOSIS — Z794 Long term (current) use of insulin: Secondary | ICD-10-CM | POA: Diagnosis not present

## 2023-10-30 DIAGNOSIS — Z992 Dependence on renal dialysis: Secondary | ICD-10-CM | POA: Insufficient documentation

## 2023-10-30 DIAGNOSIS — D631 Anemia in chronic kidney disease: Secondary | ICD-10-CM | POA: Insufficient documentation

## 2023-10-30 DIAGNOSIS — J45901 Unspecified asthma with (acute) exacerbation: Secondary | ICD-10-CM | POA: Insufficient documentation

## 2023-10-30 DIAGNOSIS — I272 Pulmonary hypertension, unspecified: Secondary | ICD-10-CM | POA: Diagnosis not present

## 2023-10-30 DIAGNOSIS — I160 Hypertensive urgency: Secondary | ICD-10-CM | POA: Insufficient documentation

## 2023-10-30 DIAGNOSIS — Z79899 Other long term (current) drug therapy: Secondary | ICD-10-CM | POA: Diagnosis not present

## 2023-10-30 DIAGNOSIS — N186 End stage renal disease: Secondary | ICD-10-CM | POA: Diagnosis not present

## 2023-10-30 DIAGNOSIS — I120 Hypertensive chronic kidney disease with stage 5 chronic kidney disease or end stage renal disease: Secondary | ICD-10-CM | POA: Diagnosis not present

## 2023-10-30 DIAGNOSIS — E1165 Type 2 diabetes mellitus with hyperglycemia: Secondary | ICD-10-CM | POA: Diagnosis not present

## 2023-10-30 DIAGNOSIS — R0602 Shortness of breath: Principal | ICD-10-CM | POA: Insufficient documentation

## 2023-10-30 DIAGNOSIS — J984 Other disorders of lung: Secondary | ICD-10-CM | POA: Diagnosis not present

## 2023-10-30 LAB — CBC
HCT: 29.7 % — ABNORMAL LOW (ref 36.0–46.0)
Hemoglobin: 9.8 g/dL — ABNORMAL LOW (ref 12.0–15.0)
MCH: 30.6 pg (ref 26.0–34.0)
MCHC: 33 g/dL (ref 30.0–36.0)
MCV: 92.8 fL (ref 80.0–100.0)
Platelets: 216 K/uL (ref 150–400)
RBC: 3.2 MIL/uL — ABNORMAL LOW (ref 3.87–5.11)
RDW: 13 % (ref 11.5–15.5)
WBC: 8.3 K/uL (ref 4.0–10.5)
nRBC: 0 % (ref 0.0–0.2)

## 2023-10-30 LAB — BASIC METABOLIC PANEL WITH GFR
Anion gap: 13 (ref 5–15)
BUN: 65 mg/dL — ABNORMAL HIGH (ref 8–23)
CO2: 24 mmol/L (ref 22–32)
Calcium: 7.8 mg/dL — ABNORMAL LOW (ref 8.9–10.3)
Chloride: 100 mmol/L (ref 98–111)
Creatinine, Ser: 5.43 mg/dL — ABNORMAL HIGH (ref 0.44–1.00)
GFR, Estimated: 8 mL/min — ABNORMAL LOW (ref >60)
Glucose, Bld: 429 mg/dL — ABNORMAL HIGH (ref 70–99)
Potassium: 3.9 mmol/L (ref 3.5–5.1)
Sodium: 137 mmol/L (ref 135–145)

## 2023-10-30 NOTE — ED Triage Notes (Addendum)
 Patient from home with c/o shortness of breath since Wednesday. Was seen by PCP and started on albuterol and she noted some minimal improvement. Patient does peritoneal dialysis at home every night. States sometimes she feels more shortness of breath if she hasn't had enough fluid taken off. Reports feeling like she could hear herself wheezing while laying there.  EMS states her RA O2 sat was 89% with the fire dept 2 L of O2 with [place] and improvement of Spo2 to 97%.   BP 206/108 in triage. Denies chest pain at this time. Not normally requiring O2.

## 2023-10-31 ENCOUNTER — Observation Stay
Admission: EM | Admit: 2023-10-31 | Discharge: 2023-10-31 | Disposition: A | Discharge status: Home / Self Care | Attending: Internal Medicine | Admitting: Internal Medicine

## 2023-10-31 ENCOUNTER — Observation Stay

## 2023-10-31 DIAGNOSIS — E1165 Type 2 diabetes mellitus with hyperglycemia: Secondary | ICD-10-CM

## 2023-10-31 DIAGNOSIS — I160 Hypertensive urgency: Secondary | ICD-10-CM | POA: Insufficient documentation

## 2023-10-31 DIAGNOSIS — Z992 Dependence on renal dialysis: Secondary | ICD-10-CM | POA: Insufficient documentation

## 2023-10-31 DIAGNOSIS — N186 End stage renal disease: Secondary | ICD-10-CM

## 2023-10-31 DIAGNOSIS — R0600 Dyspnea, unspecified: Secondary | ICD-10-CM | POA: Diagnosis present

## 2023-10-31 DIAGNOSIS — I272 Pulmonary hypertension, unspecified: Secondary | ICD-10-CM | POA: Diagnosis present

## 2023-10-31 DIAGNOSIS — D631 Anemia in chronic kidney disease: Secondary | ICD-10-CM | POA: Diagnosis present

## 2023-10-31 DIAGNOSIS — R739 Hyperglycemia, unspecified: Secondary | ICD-10-CM

## 2023-10-31 DIAGNOSIS — R0602 Shortness of breath: Principal | ICD-10-CM

## 2023-10-31 DIAGNOSIS — I10 Essential (primary) hypertension: Secondary | ICD-10-CM | POA: Insufficient documentation

## 2023-10-31 DIAGNOSIS — Z794 Long term (current) use of insulin: Secondary | ICD-10-CM

## 2023-10-31 DIAGNOSIS — J45901 Unspecified asthma with (acute) exacerbation: Secondary | ICD-10-CM

## 2023-10-31 LAB — CBG MONITORING, ED
Glucose-Capillary: 307 mg/dL — ABNORMAL HIGH (ref 70–99)
Glucose-Capillary: 309 mg/dL — ABNORMAL HIGH (ref 70–99)
Glucose-Capillary: 304 mg/dL — ABNORMAL HIGH (ref 70–99)

## 2023-10-31 MED ORDER — ACETAMINOPHEN 325 MG PO TABS: 650.0000 mg | ORAL_TABLET | Freq: Four times a day (QID) | ORAL | Status: DC | PRN

## 2023-10-31 MED ORDER — HYDRALAZINE HCL 20 MG/ML IJ SOLN
5.0000 mg | INTRAMUSCULAR | Status: DC | PRN
  Administered 2023-10-31: 5 mg via INTRAVENOUS
  Filled 2023-10-31: qty 1

## 2023-10-31 MED ORDER — ALBUTEROL SULFATE (2.5 MG/3ML) 0.083% IN NEBU: 2.5000 mg | INHALATION_SOLUTION | RESPIRATORY_TRACT | Status: DC | PRN

## 2023-10-31 MED ORDER — IPRATROPIUM-ALBUTEROL 0.5-2.5 (3) MG/3ML IN SOLN
3.0000 mL | Freq: Once | RESPIRATORY_TRACT | Status: AC
  Administered 2023-10-31: 3 mL via RESPIRATORY_TRACT

## 2023-10-31 MED ORDER — GENTAMICIN SULFATE 0.1 % EX CREA
1.0000 | TOPICAL_CREAM | Freq: Every day | CUTANEOUS | Status: DC
  Filled 2023-10-31: qty 15

## 2023-10-31 MED ORDER — IPRATROPIUM-ALBUTEROL 0.5-2.5 (3) MG/3ML IN SOLN
3.0000 mL | Freq: Once | RESPIRATORY_TRACT | Status: AC
  Administered 2023-10-31: 3 mL via RESPIRATORY_TRACT
  Filled 2023-10-31: qty 3

## 2023-10-31 MED ORDER — CARVEDILOL 25 MG PO TABS
25.0000 mg | ORAL_TABLET | Freq: Two times a day (BID) | ORAL | Status: DC
  Administered 2023-10-31: 25 mg via ORAL
  Filled 2023-10-31: qty 1

## 2023-10-31 MED ORDER — ESCITALOPRAM OXALATE 10 MG PO TABS
10.0000 mg | ORAL_TABLET | Freq: Every day | ORAL | Status: DC
  Administered 2023-10-31: 10 mg via ORAL
  Filled 2023-10-31: qty 1

## 2023-10-31 MED ORDER — ONDANSETRON HCL 4 MG/2ML IJ SOLN: 4.0000 mg | Freq: Four times a day (QID) | INTRAMUSCULAR | Status: DC | PRN

## 2023-10-31 MED ORDER — HEPARIN SODIUM (PORCINE) 5000 UNIT/ML IJ SOLN
5000.0000 [IU] | Freq: Three times a day (TID) | INTRAMUSCULAR | Status: DC
  Administered 2023-10-31: 5000 [IU] via SUBCUTANEOUS
  Filled 2023-10-31: qty 1

## 2023-10-31 MED ORDER — SPIRONOLACTONE 25 MG PO TABS
25.0000 mg | ORAL_TABLET | Freq: Every day | ORAL | Status: DC
  Administered 2023-10-31: 25 mg via ORAL
  Filled 2023-10-31: qty 1

## 2023-10-31 MED ORDER — ONDANSETRON HCL 4 MG PO TABS: 4.0000 mg | ORAL_TABLET | Freq: Four times a day (QID) | ORAL | Status: DC | PRN

## 2023-10-31 MED ORDER — ATORVASTATIN CALCIUM 20 MG PO TABS
10.0000 mg | ORAL_TABLET | Freq: Every day | ORAL | Status: DC
  Administered 2023-10-31: 10 mg via ORAL
  Filled 2023-10-31: qty 1

## 2023-10-31 MED ORDER — IRBESARTAN 150 MG PO TABS
150.0000 mg | ORAL_TABLET | Freq: Every day | ORAL | Status: DC
  Administered 2023-10-31: 150 mg via ORAL
  Filled 2023-10-31: qty 1

## 2023-10-31 MED ORDER — GENTAMICIN SULFATE 0.1 % EX CREA: 1.0000 | TOPICAL_CREAM | Freq: Every day | CUTANEOUS | 0 refills | Status: AC

## 2023-10-31 MED ORDER — TORSEMIDE 20 MG PO TABS
100.0000 mg | ORAL_TABLET | Freq: Every day | ORAL | Status: DC
  Administered 2023-10-31: 100 mg via ORAL
  Filled 2023-10-31: qty 5

## 2023-10-31 MED ORDER — INSULIN ASPART 100 UNIT/ML IJ SOLN: 0.0000 [IU] | Freq: Every day | INTRAMUSCULAR | Status: DC

## 2023-10-31 MED ORDER — DELFLEX-LC/1.5% DEXTROSE 344 MOSM/L IP SOLN
INTRAPERITONEAL | Status: DC
  Filled 2023-10-31: qty 3000

## 2023-10-31 MED ORDER — ATORVASTATIN CALCIUM 20 MG PO TABS: 20.0000 mg | ORAL_TABLET | Freq: Every day | ORAL | Status: DC

## 2023-10-31 MED ORDER — ACETAMINOPHEN 325 MG RE SUPP: 650.0000 mg | Freq: Four times a day (QID) | RECTAL | Status: DC | PRN

## 2023-10-31 MED ORDER — DELFLEX-LC/2.5% DEXTROSE 394 MOSM/L IP SOLN
INTRAPERITONEAL | Status: DC
  Filled 2023-10-31: qty 3000

## 2023-10-31 MED ORDER — INSULIN GLARGINE-YFGN 100 UNIT/ML ~~LOC~~ SOLN
15.0000 [IU] | Freq: Every day | SUBCUTANEOUS | Status: DC
  Administered 2023-10-31: 15 [IU] via SUBCUTANEOUS
  Filled 2023-10-31: qty 0.15

## 2023-10-31 MED ORDER — INSULIN ASPART 100 UNIT/ML IJ SOLN
0.0000 [IU] | Freq: Three times a day (TID) | INTRAMUSCULAR | Status: DC
  Administered 2023-10-31 (×2): 7 [IU] via SUBCUTANEOUS
  Filled 2023-10-31 (×2): qty 1

## 2023-10-31 MED ORDER — IPRATROPIUM-ALBUTEROL 0.5-2.5 (3) MG/3ML IN SOLN
3.0000 mL | Freq: Four times a day (QID) | RESPIRATORY_TRACT | Status: DC
  Administered 2023-10-31 (×2): 3 mL via RESPIRATORY_TRACT
  Filled 2023-10-31 (×2): qty 3

## 2023-10-31 NOTE — Assessment & Plan Note (Signed)
 Likely aggravated by recent prednisone use Continue basal insulin  Sliding scale coverage

## 2023-10-31 NOTE — Hospital Course (Addendum)
 Taken from H&P.  Helen Gray is a 73 y.o. female with medical history significant for HTN, ESRD on PD x 1 year, DM2,  pulmonary hypertension, restrictive lung disease on prior PFT,  , being admitted with a 3-week history of wheezing and shortness of breath, unimproved with recent rx for albuterol  and prednisone.   She has been using an inhaler about once a week for the past 5 years but over the past 3 weeks she is using it twice daily.  Daughter at the bedside concerned that they might have mold in the house because there was a leak in the roof.   On presentation stable vitals except elevated blood pressure at 206/108, labs with significant hyperglycemia at 429, otherwise at baseline. EKG with NSR at 80, chest x-ray with bilateral atelectasis versus infiltrate.  Patient was started on DuoNeb, steroid was held as there was no wheezing and she was having hyperglycemia.  8/2: Vitals with mildly elevated blood pressure at 147/103.  CBG 309, A1c pending.  Patient appears more anxious and apparently the symptoms are anxiety driven.  Prior pulmonary function testing with concern of restrictive lung disease so high-resolution CT chest was ordered which shows multiple chronic abnormalities as follows. 1. Mild mosaic attenuation of the lungs, suggesting areas of air trapping or differential perfusion, with worsening on the expiration phase. 2. Mild left upper lobe volume loss with bronchovascular consolidation/scarring posteriorly. 3. Moderate irregular stenosis of the left upper lobe bronchus and mild irregular stenosis of the left lower lobe bronchus. 4. Pulmonary nodule in the left lower lobe, measuring 9 x 8 x 9 mm. Recommend follow-up CT of the thorax in 3 months or a PET/CT fusion study of the chest based on the patient's risk factors for lung cancer. 5. Patchy and reticular opacities in the periphery of the right lung. 6. Several shotty mediastinal lymph nodes. 7. Solid thyroid nodules, including an  ovoid nodule in the right lobe measuring approximately 17 x 12 x 20 mm, a round nodule in the left lobe measuring about 9 mm, and a nodule in the lower pole of the left lobe measuring approximately 30 x 21 x 16 mm. Recommend correlation with an ultrasound of the thyroid.  CT chest was discussed with Dr. Merdis from pulmonary and he was recommending outpatient follow-up.  Lengthy discussion with daughter and patient need to have a close follow-up with PCP and pulmonary for further assistance.  Patient will get benefit from a thyroid designated ultrasound and probable a biopsy if needed.  PCP can arrange.  Patient is saturating well on room air and there is no noted hypoxia.  She will also get benefit from outpatient sleep study to rule out any other sleep apnea as she is obese.  Patient will continue her home medications.  Prednisone which was recently given by her PCP was discontinued due to significant hyperglycemia.  Patient will get benefit from cognitive behavioral therapy due to her worsening anxiety and not taking her BuSpar regularly.  She will continue her home medications and need to have a close follow-up with her providers for further assistance.

## 2023-10-31 NOTE — ED Notes (Signed)
 [place] 2L placed for O2 saturations mid 80's while patient resting. After awakening noted to be 91-92%. After [place] placement 97%.

## 2023-10-31 NOTE — ED Notes (Signed)
 Per MD Caleen patient is to be dc home and continue PD there with close follow up by PCP. These were discussed by [facility] with pts daughter in length. Awaiting arrival of family for DC.

## 2023-10-31 NOTE — ED Notes (Signed)
 MD made aware pt ambulated in hallway with O2 monitor and O2 88-93% on room air. Pt placed back on 2L via [place] when back to room.

## 2023-10-31 NOTE — Assessment & Plan Note (Addendum)
 BP over 200 arrival Hospitalized with hypertensive urgency at [facility] a year ago Continue home meds of carvedilol , irbesartan , spironolactone  and torsemide  with PRNs for additional control

## 2023-10-31 NOTE — ED Notes (Signed)
 Pt presented to ED with c/o shortness of breath starting Wednesday. States was prescribed albuterol  by PCP but states has not helped symptoms. States she normally does not wear oxygen at home but is currently requiring 2L via [place] for RA O2 91%. Pt A&Ox4. Daughter at bedside. Pt attached to cardiac monitoring, BP monitoring, and SPO2 monitoring. Pt states has not completed PD tonight.

## 2023-10-31 NOTE — ED Provider Notes (Signed)
 [facility] Provider Note    Event Date/Time   First MD Initiated Contact with Patient 10/31/23 0107     (approximate)   History   Shortness of Breath   HPI  Helen Gray is a 73 y.o. female with history of diabetes, hypertension, history of renal disease on peritoneal dialysis, asthma who presents with complaints of shortness of breath.  Has had worsening shortness of breath over the last several days, was started on prednisone and albuterol  inhaler by her PCP with some improvement.  However found to be hypoxic today and started on 2 L nasal cannula     Physical Exam   Triage Vital Signs: ED Triage Vitals [10/30/23 2252]  Encounter Vitals Group     BP (!) 206/108     Girls Systolic BP Percentile      Girls Diastolic BP Percentile      Boys Systolic BP Percentile      Boys Diastolic BP Percentile      Pulse Rate 76     Resp 18     Temp 98.7 F (37.1 C)     Temp Source Oral     SpO2 95 %     Weight 94.3 kg (208 lb)     Height 1.626 m (5' 4)     Head Circumference      Peak Flow      Pain Score 0     Pain Loc      Pain Education      Exclude from Growth Chart     Most recent vital signs: Vitals:   10/31/23 0300 10/31/23 0305  BP: (!) 200/91   Pulse: 79 74  Resp: 18 15  Temp:    SpO2: 92% 98%     General: Awake, no distress.  CV:  Good peripheral perfusion.  Resp:  Normal effort.  Positive wheezing bilaterally Abd:  No distention.  Other:  No calf pain or swelling   ED Results / Procedures / Treatments   Labs (all labs ordered are listed, but only abnormal results are displayed) Labs Reviewed  BASIC METABOLIC PANEL WITH GFR - Abnormal; Notable for the following components:      Result Value   Glucose, Bld 429 (*)    BUN 65 (*)    Creatinine, Ser 5.43 (*)    Calcium  7.8 (*)    GFR, Estimated 8 (*)    All other components within normal limits  CBC - Abnormal; Notable for the following components:   RBC 3.20 (*)     Hemoglobin 9.8 (*)    HCT 29.7 (*)    All other components within normal limits     EKG  ED ECG REPORT I, Lamar Price, the attending physician, personally viewed and interpreted this ECG.  Date: 10/31/2023  Rhythm: normal sinus rhythm QRS Axis: normal Intervals: normal ST/T Wave abnormalities: normal Narrative Interpretation: no evidence of acute ischemia    RADIOLOGY Bibasilar atelectasis or infiltrates on chest x-ray    PROCEDURES:  Critical Care performed:   Procedures   MEDICATIONS ORDERED IN ED: Medications  ipratropium-albuterol  (DUONEB) 0.5-2.5 (3) MG/3ML nebulizer solution 3 mL (3 mLs Nebulization Given 10/31/23 0137)  ipratropium-albuterol  (DUONEB) 0.5-2.5 (3) MG/3ML nebulizer solution 3 mL (3 mLs Nebulization Given 10/31/23 0137)  ipratropium-albuterol  (DUONEB) 0.5-2.5 (3) MG/3ML nebulizer solution 3 mL (3 mLs Nebulization Given 10/31/23 0157)     IMPRESSION / MDM / ASSESSMENT AND PLAN / ED COURSE  I reviewed the triage vital  signs and the nursing notes. Patient's presentation is most consistent with acute presentation with potential threat to life or bodily function.  Patient presents with shortness of breath as detailed above, she denies fevers or chills, does have occasional cough.  Differential includes asthma exacerbation, pneumonia, viral upper respiratory illness  Review of records demonstrates the patient had negative COVID test yesterday.  She is wheezing on exam, will treat with DuoNebs,   Chest x-ray demonstrates either atelectasis or infiltrates, her white blood cell count is normal, she is afebrile.  Patient treated with multiple DuoNebs, on room air ambulation she is hypoxic in the 80s, 2 L nasal cannula restarted, will consult the hospitalist for admission      FINAL CLINICAL IMPRESSION(S) / ED DIAGNOSES   Final diagnoses:  SOB (shortness of breath)  Exacerbation of asthma, unspecified asthma severity, unspecified whether persistent      Rx / DC Orders   ED Discharge Orders     None        Note:  This document was prepared using Dragon voice recognition software and may include unintentional dictation errors.   Arlander Charleston, MD 10/31/23 [phone]

## 2023-10-31 NOTE — Assessment & Plan Note (Addendum)
 Possible acute bronchitis/possible underlying COPD Restrictive lung disease on prior PFT (per past documentation) Pulmonary hypertension Prior PFTs showed restrictive lung disease(per discharge summary from [facility]) Low suspicion for acute PE(negative V/Q for similar symptoms while at [facility] in May) No evidence of fluid overload on chest x-ray Continue DuoNebs scheduled and as needed Holding off on steroids due to hyperglycemia Consider pulmonology consult Can consider CT chest

## 2023-10-31 NOTE — Progress Notes (Signed)
 MEDICATION RELATED CONSULT NOTE - FOLLOW UP   Pharmacy Consult for peritoneal dialysis monitoring   Allergies  Allergen Reactions   Amlodipine Other (See Comments)    Leg Swelling   Escitalopram  Other (See Comments)    weakness  Weakness  weakness    Weakness   Gabapentin Other (See Comments)    headache   Hydralazine  Other (See Comments)    Chest pain   Oxycodone Other (See Comments)   Aspirin    Codeine    Hydrocodone     Patient Measurements: Height: 5' 4 (162.6 cm) Weight: 94.3 kg (208 lb) IBW/kg (Calculated) : 54.7  Vital Signs: Temp: 98.1 F (36.7 C) (08/02 0854) Temp Source: Oral (08/02 0854) BP: 150/117 (08/02 1030) Pulse Rate: 80 (08/02 1030) Intake/Output from previous day: No intake/output data recorded. Intake/Output from this shift: No intake/output data recorded.  Labs: Recent Labs    10/30/23 2301  WBC 8.3  HGB 9.8*  HCT 29.7*  PLT 216  CREATININE 5.43*   Estimated Creatinine Clearance: 10 mL/min (A) (by C-G formula based on SCr of 5.43 mg/dL (H)).   Microbiology: No results found for this or any previous visit (from the past 720 hours).  Medications:  Scheduled:   [START ON 11/01/2023] atorvastatin   20 mg Oral Daily   carvedilol   25 mg Oral BID WC   escitalopram   10 mg Oral Daily   gentamicin  cream  1 Application Topical Daily   heparin   5,000 Units Subcutaneous Q8H   insulin  aspart  0-5 Units Subcutaneous QHS   insulin  aspart  0-9 Units Subcutaneous TID WC   insulin  glargine-yfgn  15 Units Subcutaneous Daily   ipratropium-albuterol   3 mL Nebulization Q6H   irbesartan   150 mg Oral Daily   spironolactone   25 mg Oral Daily   torsemide   100 mg Oral Daily   Infusions:   dialysis solution 1.5% low-MG/low-CA     dialysis solution 2.5% low-MG/low-CA     PRN: acetaminophen  **OR** acetaminophen , albuterol , hydrALAZINE , ondansetron  **OR** ondansetron  (ZOFRAN ) IV Anti-infectives (From admission, onward)    None        Assessment: 73 yo female w/PMH of ESRD on PD, DM, Pulm HTN, and HTN admitted with acute on chronic dyspnea, Pharmacy consulted for review meds daily for adjustment with peritoneal dialysis.   Plan:  No adjustments needed at this time. Pharmacy will continue to monitor and adjust orders as needed per protocol.   Helen Gray, PharmD, BCPS Clinical Pharmacist 10/31/2023 12:27 PM

## 2023-10-31 NOTE — ED Notes (Signed)
 Meal tray arrival

## 2023-10-31 NOTE — ED Notes (Addendum)
 MD made aware pt room air O2 90-93% after duonebs and that pt had coughing fit during duonebs.

## 2023-10-31 NOTE — ED Notes (Signed)
 Pt states she feels like she is wheezing worse after duoneb, Per Kinner MD, give additional duoneb if still wheezing. Expiratory wheezing noted bilaterally.

## 2023-10-31 NOTE — ED Notes (Signed)
 Para March DO at bedside

## 2023-10-31 NOTE — H&P (Signed)
 History and Physical    Patient: Helen Gray FMW:[id number] DOB: [birth year] DOA: 10/31/2023 DOS: the patient was seen and examined on 10/31/2023 PCP: Odell Tor Edra CINDERELLA, MD  Patient coming from: Home  Chief Complaint:  Chief Complaint  Patient presents with   Shortness of Breath    HPI: Helen Gray is a 73 y.o. female with medical history significant for HTN, ESRD on PD x 1 year, DM2,  pulmonary hypertension, restrictive lung disease on prior PFT,  , being admitted with a 3-week history of wheezing and shortness of breath, unimproved with recent rx for albuterol  and prednisone.  She denies chest pain, fever or chills, lower extremity pain or sewelling.  She is compliant with her dialysis sessions and takes medications as prescribed. Patient states she was diagnosed with asthma about 5 years ago however review of discharge summary from [facility] from a year ago states that she had a prior PFT that was negative for obstructive lung disease but showed restrictive lung disease.  Patient states she has had a negative sleep study in the past.  She has been using an inhaler about once a week for the past 5 years but over the past 3 weeks she is using it twice daily.  Daughter at the bedside concerned that they might have mold in the house because there was a leak in the roof. In the ED, BP 206/108 with otherwise normal vitals.  Labs notable for glucose of 429 but otherwise her baseline.  EKG with NSR at 80.  Chest x-ray showing bilateral atelectasis versus infiltrates. Patient was treated with DuoNebs but continued to be short of breath. Admission requested     Review of Systems: As mentioned in the history of present illness. All other systems reviewed and are negative.  Past Medical History:  Diagnosis Date   Diabetes mellitus without complication (HCC)    Hypertension    Renal disorder    Past Surgical History:  Procedure Laterality Date   ABDOMINAL HYSTERECTOMY     BACK SURGERY      CHOLECYSTECTOMY     Social History:  reports that she has never smoked. She has never used smokeless tobacco. She reports that she does not currently use alcohol. No history on file for drug use.  Allergies  Allergen Reactions   Amlodipine Other (See Comments)    Leg Swelling   Escitalopram  Other (See Comments)    weakness  Weakness  weakness    Weakness   Gabapentin Other (See Comments)    headache   Hydralazine  Other (See Comments)    Chest pain   Oxycodone Other (See Comments)   Aspirin    Codeine    Hydrocodone     History reviewed. No pertinent family history.  Prior to Admission medications   Medication Sig Start Date End Date Taking? Authorizing Provider  albuterol  (VENTOLIN  HFA) 108 (90 Base) MCG/ACT inhaler Inhale into the lungs.    [provider]  atorvastatin  (LIPITOR) 10 MG tablet Take by mouth. 08/21/22   [provider]  atorvastatin  (LIPITOR) 20 MG tablet Take by mouth. 06/24/21   [provider]  carvedilol  (COREG ) 25 MG tablet Take by mouth. 08/20/22 08/20/23  [provider]  Continuous Glucose Receiver (DEXCOM G7 RECEIVER) DEVI See admin instructions. 10/02/21   [provider]  Continuous Glucose Sensor (DEXCOM G7 SENSOR) MISC Use 1 Device every 10 (ten) days 10/02/21   [provider]  cyclobenzaprine  (FLEXERIL ) 5 MG tablet Take 1 tablet (5 mg total)  by mouth 3 (three) times daily as needed. 06/30/20   Menshew, Candida LULLA Kings, PA-C  ergocalciferol (VITAMIN D2) 1.25 MG (50000 UT) capsule Take by mouth. 11/24/22   [provider]  escitalopram  (LEXAPRO ) 10 MG tablet Take 10 mg by mouth daily. 03/27/23   [provider]  insulin  glargine (LANTUS  SOLOSTAR) 100 UNIT/ML Solostar Pen Inject into the skin. 02/01/21 03/06/23  [provider]  insulin  lispro (HUMALOG) 100 UNIT/ML KwikPen Breakfast 8 units,  Dinner 12 units 02/01/21   [provider]  Insulin  Pen Needle (BD PEN NEEDLE NANO  2ND GEN) 32G X 4 MM MISC USE 4 TIMES DAILY AS DIRECTED 06/15/22   [provider]  Insulin  Pen Needle (BD PEN NEEDLE NANO U/F) 32G X 4 MM MISC Use to inject insulin  4 times daily 01/16/22   [provider]  irbesartan  (AVAPRO ) 150 MG tablet Take 150 mg by mouth daily.    [provider]  magnesium 30 MG tablet Take 30 mg by mouth 2 (two) times daily.    [provider]  Multiple Vitamin (MULTIVITAMIN) tablet Take 1 tablet by mouth daily.    [provider]  omeprazole (PRILOSEC) 20 MG capsule Take 20 mg by mouth daily.    [provider]  potassium chloride SA (KLOR-CON M) 20 MEQ tablet Take 20 mEq by mouth 2 (two) times daily.    [provider]  pregabalin (LYRICA) 25 MG capsule Take 25 mg by mouth 2 (two) times daily.    [provider]  spironolactone  (ALDACTONE ) 25 MG tablet Take 25 mg by mouth daily.    [provider]  torsemide  (DEMADEX ) 100 MG tablet Take 100 mg by mouth daily.    [provider]  trimethoprim  (TRIMPEX ) 100 MG tablet Take 1 tablet (100 mg total) by mouth daily. 06/12/23   Maurine Lukes, PA-C    Physical Exam: Vitals:   10/31/23 0130 10/31/23 0230 10/31/23 0300 10/31/23 0305  BP: (!) 197/84 (!) 193/82 (!) 200/91   Pulse: 71 71 79 74  Resp: 12 17 18 15   Temp:      TempSrc:      SpO2: 100% 91% 92% 98%  Weight:      Height:       Physical Exam Vitals and nursing note reviewed.  Constitutional:      General: She is not in acute distress. HENT:     Head: Normocephalic and atraumatic.  Cardiovascular:     Rate and Rhythm: Normal rate and regular rhythm.     Heart sounds: Normal heart sounds.  Pulmonary:     Effort: Pulmonary effort is normal.     Breath sounds: Normal breath sounds.  Abdominal:     Palpations: Abdomen is soft.     Tenderness: There is no abdominal tenderness.  Neurological:     Mental Status: Mental status is at baseline.     Labs on  Admission: I have personally reviewed following labs and imaging studies  CBC: Recent Labs  Lab 10/30/23 2301  WBC 8.3  HGB 9.8*  HCT 29.7*  MCV 92.8  PLT 216   Basic Metabolic Panel: Recent Labs  Lab 10/30/23 2301  NA 137  K 3.9  CL 100  CO2 24  GLUCOSE 429*  BUN 65*  CREATININE 5.43*  CALCIUM  7.8*   GFR: Estimated Creatinine Clearance: 10 mL/min (A) (by C-G formula based on SCr of 5.43 mg/dL (H)). Liver Function Tests: No results for input(s): AST, ALT, ALKPHOS,  BILITOT, PROT, ALBUMIN in the last 168 hours. No results for input(s): LIPASE, AMYLASE in the last 168 hours. No results for input(s): AMMONIA in the last 168 hours. Coagulation Profile: No results for input(s): INR, PROTIME in the last 168 hours. Cardiac Enzymes: No results for input(s): CKTOTAL, CKMB, CKMBINDEX, TROPONINI in the last 168 hours. BNP (last 3 results) No results for input(s): PROBNP in the last 8760 hours. HbA1C: No results for input(s): HGBA1C in the last 72 hours. CBG: No results for input(s): GLUCAP in the last 168 hours. Lipid Profile: No results for input(s): CHOL, HDL, LDLCALC, TRIG, CHOLHDL, LDLDIRECT in the last 72 hours. Thyroid Function Tests: No results for input(s): TSH, T4TOTAL, FREET4, T3FREE, THYROIDAB in the last 72 hours. Anemia Panel: No results for input(s): VITAMINB12, FOLATE, FERRITIN, TIBC, IRON, RETICCTPCT in the last 72 hours. Urine analysis:    Component Value Date/Time   APPEARANCEUR Cloudy (A) 06/04/2023 1138   GLUCOSEU Negative 06/04/2023 1138   BILIRUBINUR Negative 06/04/2023 1138   PROTEINUR 3+ (A) 06/04/2023 1138   NITRITE Negative 06/04/2023 1138   LEUKOCYTESUR 1+ (A) 06/04/2023 1138    Radiological Exams on Admission: DG Chest 2 View Result Date: 10/30/2023 EXAM: 2 VIEW(S) XRAY OF THE CHEST 10/29/2023 01:42:45 PM COMPARISON: Oct 15 2022 CLINICAL HISTORY: Asthma, moderate persistent  asthma with exacerbation. Patient states she had an asthma attack this morning, no symptoms at this time. History of hypertension, asthma, diabetes. FINDINGS: LUNGS AND PLEURA: Right basilar atelectasis or infiltrates. No pulmonary edema. No pleural effusion. No pneumothorax. HEART AND MEDIASTINUM: No acute abnormality of the cardiac and mediastinal silhouettes. BONES AND SOFT TISSUES: No acute osseous abnormality. IMPRESSION: 1. Right basilar atelectasis or infiltrates. Electronically signed by: Norman Gatlin MD 10/30/2023 11:23 PM EDT RP Workstation: [id number]   DG Chest 2 View Result Date: 10/30/2023 EXAM: 2 VIEW(S) XRAY OF THE CHEST 10/30/2023 11:16:42 PM COMPARISON: 10/29/2023 CLINICAL HISTORY: shob. Patient from home with c/o shortness of breath since Wednesday. Was seen by PCP and started on albuterol  and she noted some minimal improvement. Patient does peritoneal dialysis at home every night. States sometimes she feels more shortness of breath if she hasn't had enough fluid taken off. Reports feeling like she could hear herself wheezing while laying there. EMS states her RA O2 sat was 89% with the fire dept 2 L of O2 with [place] and improvement of Spo2 to 97%. FINDINGS: LUNGS AND PLEURA: Bibasilar atelectasis or infiltrates. The lungs are otherwise clear. No pleural effusion. No pneumothorax. HEART AND MEDIASTINUM: Stable cardiomediastinal silhouette. BONES AND SOFT TISSUES: No acute osseous abnormality. IMPRESSION: 1. Bibasilar atelectasis or infiltrates. Electronically signed by: Norman Gatlin MD 10/30/2023 11:22 PM EDT RP Workstation: [id number]   Data Reviewed for HPI: Relevant notes from primary care and specialist visits, past discharge summaries as available in EHR, including Care Everywhere. Prior diagnostic testing as pertinent to current admission diagnoses Updated medications and problem lists for reconciliation ED course, including vitals, labs, imaging, treatment and response to  treatment Triage notes, nursing and pharmacy notes and ED provider's notes Notable results as noted above in HPI      Assessment and Plan: * Acute on chronic dyspnea Possible acute bronchitis/possible underlying COPD Restrictive lung disease on prior PFT (per past documentation) Pulmonary hypertension Prior PFTs showed restrictive lung disease(per discharge summary from [facility]) Low suspicion for acute PE(negative V/Q for similar symptoms while at [facility] in May) No evidence of fluid overload on chest x-ray Continue DuoNebs scheduled and as needed Holding off  on steroids due to hyperglycemia Consider pulmonology consult Can consider CT chest  Hypertensive urgency BP over 200 arrival Hospitalized with hypertensive urgency at [facility] a year ago Continue home meds of carvedilol , irbesartan , spironolactone  and torsemide  with PRNs for additional control   ESRD on peritoneal dialysis [facility]) Patient compliant with dialysis No evidence of fluid overload Nephrology consulted to assist with continuation of peritoneal dialysis  Uncontrolled type 2 diabetes mellitus with hyperglycemia, with long-term current use of insulin  (HCC) Likely aggravated by recent prednisone use Continue basal insulin  Sliding scale coverage        DVT prophylaxis: heparin   Consults: renal  Advance Care Planning: full code  Family Communication: none  Disposition Plan: Back to previous home environment  Severity of Illness: The appropriate patient status for this patient is OBSERVATION. Observation status is judged to be reasonable and necessary in order to provide the required intensity of service to ensure the patient's safety. The patient's presenting symptoms, physical exam findings, and initial radiographic and laboratory data in the context of their medical condition is felt to place them at decreased risk for further clinical deterioration. Furthermore, it is anticipated that the patient will be  medically stable for discharge from the hospital within 2 midnights of admission.   Author: Delayne LULLA Solian, MD 10/31/2023 3:59 AM  For on call review [email or web address].

## 2023-10-31 NOTE — Discharge Summary (Signed)
 Physician Discharge Summary   Patient: Helen Gray MRN: [id number] DOB: Nov 04, 1950  Admit date:     10/31/2023  Discharge date: 10/31/23  Discharge Physician: Amaryllis Dare   PCP: Odell Chard, Edra GRADE, MD   Recommendations at discharge:  Please obtain CBC and BMP and follow-up Follow-up with pulmonology Patient needs a repeat CT chest or PET scan in 3 months as recommended by radiology. Patient also need a thyroid designated ultrasound and possible biopsy if needed. Follow-up with primary care provider Follow-up with nephrology and she will continue her peritoneal dialysis  Discharge Diagnoses: Principal Problem:   Acute dyspnea Active Problems:   Uncontrolled type 2 diabetes mellitus with hyperglycemia, with long-term current use of insulin  (HCC)   ESRD on peritoneal dialysis (HCC)   Hypertensive urgency   Anemia in chronic kidney disease   Pulmonary hypertension, unspecified (HCC)   Hyperglycemia   Hospital Course: Taken from H&P.  Helen Gray is a 73 y.o. female with medical history significant for HTN, ESRD on PD x 1 year, DM2,  pulmonary hypertension, restrictive lung disease on prior PFT,  , being admitted with a 3-week history of wheezing and shortness of breath, unimproved with recent rx for albuterol  and prednisone.   She has been using an inhaler about once a week for the past 5 years but over the past 3 weeks she is using it twice daily.  Daughter at the bedside concerned that they might have mold in the house because there was a leak in the roof.   On presentation stable vitals except elevated blood pressure at 206/108, labs with significant hyperglycemia at 429, otherwise at baseline. EKG with NSR at 80, chest x-ray with bilateral atelectasis versus infiltrate.  Patient was started on DuoNeb, steroid was held as there was no wheezing and she was having hyperglycemia.  8/2: Vitals with mildly elevated blood pressure at 147/103.  CBG 309, A1c pending.  Patient appears  more anxious and apparently the symptoms are anxiety driven.  Prior pulmonary function testing with concern of restrictive lung disease so high-resolution CT chest was ordered which shows multiple chronic abnormalities as follows. 1. Mild mosaic attenuation of the lungs, suggesting areas of air trapping or differential perfusion, with worsening on the expiration phase. 2. Mild left upper lobe volume loss with bronchovascular consolidation/scarring posteriorly. 3. Moderate irregular stenosis of the left upper lobe bronchus and mild irregular stenosis of the left lower lobe bronchus. 4. Pulmonary nodule in the left lower lobe, measuring 9 x 8 x 9 mm. Recommend follow-up CT of the thorax in 3 months or a PET/CT fusion study of the chest based on the patient's risk factors for lung cancer. 5. Patchy and reticular opacities in the periphery of the right lung. 6. Several shotty mediastinal lymph nodes. 7. Solid thyroid nodules, including an ovoid nodule in the right lobe measuring approximately 17 x 12 x 20 mm, a round nodule in the left lobe measuring about 9 mm, and a nodule in the lower pole of the left lobe measuring approximately 30 x 21 x 16 mm. Recommend correlation with an ultrasound of the thyroid.  CT chest was discussed with Dr. Merdis from pulmonary and he was recommending outpatient follow-up.  Lengthy discussion with daughter and patient need to have a close follow-up with PCP and pulmonary for further assistance.  Patient will get benefit from a thyroid designated ultrasound and probable a biopsy if needed.  PCP can arrange.  Patient is saturating well on room air and there is  no noted hypoxia.  She will also get benefit from outpatient sleep study to rule out any other sleep apnea as she is obese.  Patient will continue her home medications.  Prednisone which was recently given by her PCP was discontinued due to significant hyperglycemia.  Patient will get benefit from cognitive  behavioral therapy due to her worsening anxiety and not taking her BuSpar regularly.  She will continue her home medications and need to have a close follow-up with her providers for further assistance.   Assessment and Plan: * Acute dyspnea Possible acute bronchitis/possible underlying COPD Restrictive lung disease on prior PFT (per past documentation) Pulmonary hypertension Prior PFTs showed restrictive lung disease(per discharge summary from [facility]) Low suspicion for acute PE(negative V/Q for similar symptoms while at [facility] in May) No evidence of fluid overload on chest x-ray Holding off on steroids due to hyperglycemia High-resolution chest CT chest was done and case was discussed with pulmonary who are recommending outpatient follow-up.  Hypertensive urgency BP over 200 arrival, has been improved now Hospitalized with hypertensive urgency at [facility] a year ago Continue home meds of carvedilol , irbesartan , spironolactone  and torsemide  with PRNs for additional control  ESRD on peritoneal dialysis [facility]) Patient compliant with dialysis No evidence of fluid overload Continue with peritoneal dialysis  Uncontrolled type 2 diabetes mellitus with hyperglycemia, with long-term current use of insulin  (HCC) Likely aggravated by recent prednisone use Continue basal insulin  Sliding scale coverage Resume home medications on discharge  Consultants: Curbside pulmonology, nephrology Procedures performed: None Disposition: Home Diet recommendation:  Discharge Diet Orders (From admission, onward)     Start     Ordered   10/31/23 0000  Diet - low sodium heart healthy        10/31/23 1420           Cardiac and Carb modified diet DISCHARGE MEDICATION: Allergies as of 10/31/2023       Reactions   Amlodipine Other (See Comments)   Leg Swelling   Escitalopram  Other (See Comments)   weakness Weakness weakness    Weakness   Gabapentin Other (See Comments)   headache   Hydralazine  Other  (See Comments)   Chest pain   Oxycodone Other (See Comments)   Aspirin    Codeine    Hydrocodone         Medication List     STOP taking these medications    cyclobenzaprine  5 MG tablet Commonly known as: FLEXERIL    predniSONE 20 MG tablet Commonly known as: DELTASONE   trimethoprim  100 MG tablet Commonly known as: TRIMPEX        TAKE these medications    albuterol  108 (90 Base) MCG/ACT inhaler Commonly known as: VENTOLIN  HFA Inhale into the lungs.   atorvastatin  20 MG tablet Commonly known as: LIPITOR Take by mouth.   BD Pen Needle Nano U/F 32G X 4 MM Misc Generic drug: Insulin  Pen Needle Use to inject insulin  4 times daily   BD Pen Needle Nano 2nd Gen 32G X 4 MM Misc Generic drug: Insulin  Pen Needle USE 4 TIMES DAILY AS DIRECTED   busPIRone 5 MG tablet Commonly known as: BUSPAR Take 5 mg by mouth 2 (two) times daily.   carvedilol  25 MG tablet Commonly known as: COREG  Take by mouth.   ergocalciferol 1.25 MG (50000 UT) capsule Commonly known as: VITAMIN D2 Take 50,000 Units by mouth once a week.   escitalopram  10 MG tablet Commonly known as: LEXAPRO  Take 10 mg by mouth daily.   fluticasone-salmeterol  100-50 MCG/ACT Aepb Commonly known as: ADVAIR Inhale 1 puff into the lungs 2 (two) times daily.   gentamicin  cream 0.1 % Commonly known as: GARAMYCIN  Apply 1 Application topically daily.   insulin  lispro 100 UNIT/ML KwikPen Commonly known as: HUMALOG Inject 10 Units into the skin 2 (two) times daily. 10 units with breakfast and 18 units at dinner   ipratropium-albuterol  0.5-2.5 (3) MG/3ML Soln Commonly known as: DUONEB Inhale 3 mLs into the lungs every 6 (six) hours as needed.   irbesartan  150 MG tablet Commonly known as: AVAPRO  Take 150 mg by mouth daily.   ketoconazole 2 % shampoo Commonly known as: NIZORAL Apply 1 Application topically 2 (two) times a week.   Lantus  SoloStar 100 UNIT/ML Solostar Pen Generic drug: insulin   glargine Inject 20 Units into the skin at bedtime.   multivitamin tablet Take 1 tablet by mouth daily.   omeprazole 20 MG capsule Commonly known as: PRILOSEC Take 20 mg by mouth daily.   potassium chloride SA 20 MEQ tablet Commonly known as: KLOR-CON M Take 20 mEq by mouth 2 (two) times daily.   pregabalin 25 MG capsule Commonly known as: LYRICA Take 25 mg by mouth 2 (two) times daily.   spironolactone  25 MG tablet Commonly known as: ALDACTONE  Take 25 mg by mouth daily.   torsemide  100 MG tablet Commonly known as: DEMADEX  Take 100 mg by mouth daily.               Discharge Care Instructions  (From admission, onward)           Start     Ordered   10/31/23 0000  Discharge wound care:       Comments: Clean skin near exit site with chloraprep swab sticks.  Starting at catheter, use circular pattern around exit site, moving towards outer edges of area covered by dressing.  Apply gentamicin  cream to site once daily.  Cover with dry dressing.   10/31/23 1420            Follow-up Information     Odell Chard, Edra GRADE, MD. Schedule an appointment as soon as possible for a visit in 1 week(s).   Specialty: Family Medicine Contact information: [address] [place] [address] [phone]         Parris Manna, MD. Schedule an appointment as soon as possible for a visit in 1 week(s).   Specialty: Pulmonary Disease Contact information: [address] [place] [address] [phone]                Discharge Exam: Helen Gray   10/30/23 2252  Weight: 94.3 kg   General.  Obese elderly lady, in no acute distress. Pulmonary.  Lungs clear bilaterally, normal respiratory effort. CV.  Regular rate and rhythm, no JVD, rub or murmur. Abdomen.  Soft, nontender, nondistended, BS positive. CNS.  Alert and oriented .  No focal neurologic deficit. Extremities.  No edema, no cyanosis, pulses intact and symmetrical. Psychiatry.  Appears  anxious  Condition at discharge: stable  The results of significant diagnostics from this hospitalization (including imaging, microbiology, ancillary and laboratory) are listed below for reference.   Imaging Studies: CT Chest High Resolution Result Date: 10/31/2023 EXAM: HIGH RESOLUTION CHEST 10/31/2023 10:52:25 AM TECHNIQUE: CT of the chest was performed without the administration of intravenous contrast. High resolution CT imaging was performed of the lungs. Multiplanar reformatted images are provided for review. Automated exposure control, iterative reconstruction, and/or weight based adjustment of the mA/kV was  utilized to reduce the radiation dose to as low as reasonably achievable. High resolution CT images were performed in the supine inspiration, supine expiration, and prone inspiration positions. COMPARISON: PA and lateral radiographs of the chest dated 10/30/2023. CLINICAL HISTORY: Dyspnea, chronic, unclear etiology; Concern of interstitial lung disease. Patient from home with c/o shortness of breath since Wednesday. Was seen by PCP and started on albuterol  and she noted some minimal improvement. Patient does peritoneal dialysis at home every night. States sometimes she feels more shortness of breath if she hasn't had enough fluid taken off. Reports feeling like she could hear herself wheezing while laying there. EMS states her RA O2 sat was 89% with the fire dept 2 L of O2 with [place] and improvement of Spo2 to 97%. BP 206/108 in triage. Denies chest pain at this time. Not normally requiring O2. FINDINGS: MEDIASTINUM: There are several shotty mediastinal lymph nodes. There is no calcific coronary artery disease present, but there is calcification within the aortic root and thoracic aorta. LYMPH NODES: There are several shotty mediastinal lymph nodes. HRCT FINDINGS AND LUNGS AND PLEURA: There is mild mosaic attenuation of the lungs suggesting areas of air trapping or differential perfusion. There is  mild left upper lobe volume loss present with bronchovascular consolidation/scarring present posteriorly. There is a pulmonary nodule present medially within the left lower lobe, image 47 of series 5, measuring 9 x 8 x 9 mm. There are patchy and reticular opacities present within the periphery of the right lung. There is moderate irregular stenosis of the left upper lobe bronchus and there is mild irregular stenosis of the left lower lobe bronchus. There is worsening of mosaic attenuation of the lungs on the expiration phase, indicating areas of air trapping. UPPER ABDOMEN: No acute abnormality. SOFT TISSUES AND BONES: There is an ovoid nodule within the right lobe of the thyroid, measuring approximately 17 x 12 x 20 mm. There is a round nodule within the left lobe of the thyroid measuring about 9 mm. There is also a nodule within the lower pole of the left lobe measuring approximately 30 x 21 x 16 mm. RECOMMENDATIONS: Follow-up CT of the thorax in 3 months or a PET/CT fusion study of the chest is recommended based on the patient's risk factors for lung cancer. Correlation with an ultrasound of the thyroid is recommended for the solid thyroid nodules. IMPRESSION: 1. Mild mosaic attenuation of the lungs, suggesting areas of air trapping or differential perfusion, with worsening on the expiration phase. 2. Mild left upper lobe volume loss with bronchovascular consolidation/scarring posteriorly. 3. Moderate irregular stenosis of the left upper lobe bronchus and mild irregular stenosis of the left lower lobe bronchus. 4. Pulmonary nodule in the left lower lobe, measuring 9 x 8 x 9 mm. Recommend follow-up CT of the thorax in 3 months or a PET/CT fusion study of the chest based on the patient's risk factors for lung cancer. 5. Patchy and reticular opacities in the periphery of the right lung. 6. Several shotty mediastinal lymph nodes. 7. Solid thyroid nodules, including an ovoid nodule in the right lobe measuring  approximately 17 x 12 x 20 mm, a round nodule in the left lobe measuring about 9 mm, and a nodule in the lower pole of the left lobe measuring approximately 30 x 21 x 16 mm. Recommend correlation with an ultrasound of the thyroid. Electronically signed by: evalene coho 10/31/2023 11:25 AM EDT RP Workstation: [id number]   DG Chest 2 View Result Date: 10/30/2023 EXAM:  2 VIEW(S) XRAY OF THE CHEST 10/29/2023 01:42:45 PM COMPARISON: Oct 15 2022 CLINICAL HISTORY: Asthma, moderate persistent asthma with exacerbation. Patient states she had an asthma attack this morning, no symptoms at this time. History of hypertension, asthma, diabetes. FINDINGS: LUNGS AND PLEURA: Right basilar atelectasis or infiltrates. No pulmonary edema. No pleural effusion. No pneumothorax. HEART AND MEDIASTINUM: No acute abnormality of the cardiac and mediastinal silhouettes. BONES AND SOFT TISSUES: No acute osseous abnormality. IMPRESSION: 1. Right basilar atelectasis or infiltrates. Electronically signed by: Norman Gatlin MD 10/30/2023 11:23 PM EDT RP Workstation: [id number]   DG Chest 2 View Result Date: 10/30/2023 EXAM: 2 VIEW(S) XRAY OF THE CHEST 10/30/2023 11:16:42 PM COMPARISON: 10/29/2023 CLINICAL HISTORY: shob. Patient from home with c/o shortness of breath since Wednesday. Was seen by PCP and started on albuterol  and she noted some minimal improvement. Patient does peritoneal dialysis at home every night. States sometimes she feels more shortness of breath if she hasn't had enough fluid taken off. Reports feeling like she could hear herself wheezing while laying there. EMS states her RA O2 sat was 89% with the fire dept 2 L of O2 with [place] and improvement of Spo2 to 97%. FINDINGS: LUNGS AND PLEURA: Bibasilar atelectasis or infiltrates. The lungs are otherwise clear. No pleural effusion. No pneumothorax. HEART AND MEDIASTINUM: Stable cardiomediastinal silhouette. BONES AND SOFT TISSUES: No acute osseous abnormality. IMPRESSION: 1.  Bibasilar atelectasis or infiltrates. Electronically signed by: Norman Gatlin MD 10/30/2023 11:22 PM EDT RP Workstation: [id number]    Microbiology: Results for orders placed or performed in visit on 06/04/23  Microscopic Examination     Status: Abnormal   Collection Time: 06/04/23 11:38 AM   Urine  Result Value Ref Range Status   WBC, UA >30 (A) 0 - 5 /hpf Final   RBC, Urine 0-2 0 - 2 /hpf Final   Epithelial Cells (non renal) 0-10 0 - 10 /hpf Final   Bacteria, UA Many (A) None seen/Few Final    Labs: CBC: Recent Labs  Lab 10/30/23 2301  WBC 8.3  HGB 9.8*  HCT 29.7*  MCV 92.8  PLT 216   Basic Metabolic Panel: Recent Labs  Lab 10/30/23 2301  NA 137  K 3.9  CL 100  CO2 24  GLUCOSE 429*  BUN 65*  CREATININE 5.43*  CALCIUM  7.8*   Liver Function Tests: No results for input(s): AST, ALT, ALKPHOS, BILITOT, PROT, ALBUMIN in the last 168 hours. CBG: Recent Labs  Lab 10/31/23 0512 10/31/23 0749 10/31/23 1105  GLUCAP 307* 309* 304*    Discharge time spent: greater than 30 minutes.  This record has been created using Conservation officer, historic buildings. Errors have been sought and corrected,but may not always be located. Such creation errors do not reflect on the standard of care.   Signed: Amaryllis Dare, MD [facility] 10/31/2023

## 2023-10-31 NOTE — ED Notes (Signed)
Kinner MD at bedside 

## 2023-10-31 NOTE — Assessment & Plan Note (Signed)
 Patient compliant with dialysis No evidence of fluid overload Nephrology consulted to assist with continuation of peritoneal dialysis

## 2023-10-31 NOTE — Progress Notes (Signed)
 [facility]  ROUNDING NOTE   Subjective:  Patient seen and evaluated in the ED at bedside.  Patient denies pain, endorses last bowel movement yesterday.  No other complaints to offer.  Patient acknowledges resuming PD treatment tonight.   Objective:  Vital signs in last 24 hours:  Temp:  [97.7 F (36.5 C)-98.7 F (37.1 C)] 97.7 F (36.5 C) (08/02 1430) Pulse Rate:  [67-81] 67 (08/02 1430) Resp:  [11-24] 18 (08/02 1430) BP: (147-206)/(76-131) 160/79 (08/02 1430) SpO2:  [91 %-100 %] 97 % (08/02 1430) Weight:  [94.3 kg] 94.3 kg (08/01 2252)  Weight change:  Filed Weights   10/30/23 2252  Weight: 94.3 kg    Intake/Output: No intake/output data recorded.   Intake/Output this shift:  No intake/output data recorded.  Physical Exam: General: NAD  Head: Normocephalic  Eyes: Anicteric  Neck: Supple, trachea midline  Lungs:  2 L nasal cannula, scattered wheezes and rales.  Heart: Regular rate   Abdomen:  Soft, nontender  Extremities: Trace peripheral edema.  Neurologic: Alert  Skin: No lesions  Access: PD [place]    Basic Metabolic Panel: Recent Labs  Lab 10/30/23 2301  NA 137  K 3.9  CL 100  CO2 24  GLUCOSE 429*  BUN 65*  CREATININE 5.43*  CALCIUM  7.8*    Liver Function Tests: No results for input(s): AST, ALT, ALKPHOS, BILITOT, PROT, ALBUMIN in the last 168 hours. No results for input(s): LIPASE, AMYLASE in the last 168 hours. No results for input(s): AMMONIA in the last 168 hours.  CBC: Recent Labs  Lab 10/30/23 2301  WBC 8.3  HGB 9.8*  HCT 29.7*  MCV 92.8  PLT 216    Cardiac Enzymes: No results for input(s): CKTOTAL, CKMB, CKMBINDEX, TROPONINI in the last 168 hours.  BNP: Invalid input(s): POCBNP  CBG: Recent Labs  Lab 10/31/23 0512 10/31/23 0749 10/31/23 1105  GLUCAP 307* 309* 304*    Microbiology: Results for orders placed or performed in visit on 06/04/23  Microscopic Examination      Status: Abnormal   Collection Time: 06/04/23 11:38 AM   Urine  Result Value Ref Range Status   WBC, UA >30 (A) 0 - 5 /hpf Final   RBC, Urine 0-2 0 - 2 /hpf Final   Epithelial Cells (non renal) 0-10 0 - 10 /hpf Final   Bacteria, UA Many (A) None seen/Few Final    Coagulation Studies: No results for input(s): LABPROT, INR in the last 72 hours.  Urinalysis: No results for input(s): COLORURINE, LABSPEC, PHURINE, GLUCOSEU, HGBUR, BILIRUBINUR, KETONESUR, PROTEINUR, UROBILINOGEN, NITRITE, LEUKOCYTESUR in the last 72 hours.  Invalid input(s): APPERANCEUR    Imaging: CT Chest High Resolution Result Date: 10/31/2023 EXAM: HIGH RESOLUTION CHEST 10/31/2023 10:52:25 AM TECHNIQUE: CT of the chest was performed without the administration of intravenous contrast. High resolution CT imaging was performed of the lungs. Multiplanar reformatted images are provided for review. Automated exposure control, iterative reconstruction, and/or weight based adjustment of the mA/kV was utilized to reduce the radiation dose to as low as reasonably achievable. High resolution CT images were performed in the supine inspiration, supine expiration, and prone inspiration positions. COMPARISON: PA and lateral radiographs of the chest dated 10/30/2023. CLINICAL HISTORY: Dyspnea, chronic, unclear etiology; Concern of interstitial lung disease. Patient from home with c/o shortness of breath since Wednesday. Was seen by PCP and started on albuterol  and she noted some minimal improvement. Patient does peritoneal dialysis at home every night. States sometimes she feels more shortness of  breath if she hasn't had enough fluid taken off. Reports feeling like she could hear herself wheezing while laying there. EMS states her RA O2 sat was 89% with the fire dept 2 L of O2 with [place] and improvement of Spo2 to 97%. BP 206/108 in triage. Denies chest pain at this time. Not normally requiring O2. FINDINGS: MEDIASTINUM:  There are several shotty mediastinal lymph nodes. There is no calcific coronary artery disease present, but there is calcification within the aortic root and thoracic aorta. LYMPH NODES: There are several shotty mediastinal lymph nodes. HRCT FINDINGS AND LUNGS AND PLEURA: There is mild mosaic attenuation of the lungs suggesting areas of air trapping or differential perfusion. There is mild left upper lobe volume loss present with bronchovascular consolidation/scarring present posteriorly. There is a pulmonary nodule present medially within the left lower lobe, image 47 of series 5, measuring 9 x 8 x 9 mm. There are patchy and reticular opacities present within the periphery of the right lung. There is moderate irregular stenosis of the left upper lobe bronchus and there is mild irregular stenosis of the left lower lobe bronchus. There is worsening of mosaic attenuation of the lungs on the expiration phase, indicating areas of air trapping. UPPER ABDOMEN: No acute abnormality. SOFT TISSUES AND BONES: There is an ovoid nodule within the right lobe of the thyroid, measuring approximately 17 x 12 x 20 mm. There is a round nodule within the left lobe of the thyroid measuring about 9 mm. There is also a nodule within the lower pole of the left lobe measuring approximately 30 x 21 x 16 mm. RECOMMENDATIONS: Follow-up CT of the thorax in 3 months or a PET/CT fusion study of the chest is recommended based on the patient's risk factors for lung cancer. Correlation with an ultrasound of the thyroid is recommended for the solid thyroid nodules. IMPRESSION: 1. Mild mosaic attenuation of the lungs, suggesting areas of air trapping or differential perfusion, with worsening on the expiration phase. 2. Mild left upper lobe volume loss with bronchovascular consolidation/scarring posteriorly. 3. Moderate irregular stenosis of the left upper lobe bronchus and mild irregular stenosis of the left lower lobe bronchus. 4. Pulmonary nodule  in the left lower lobe, measuring 9 x 8 x 9 mm. Recommend follow-up CT of the thorax in 3 months or a PET/CT fusion study of the chest based on the patient's risk factors for lung cancer. 5. Patchy and reticular opacities in the periphery of the right lung. 6. Several shotty mediastinal lymph nodes. 7. Solid thyroid nodules, including an ovoid nodule in the right lobe measuring approximately 17 x 12 x 20 mm, a round nodule in the left lobe measuring about 9 mm, and a nodule in the lower pole of the left lobe measuring approximately 30 x 21 x 16 mm. Recommend correlation with an ultrasound of the thyroid. Electronically signed by: evalene coho 10/31/2023 11:25 AM EDT RP Workstation: [id number]   DG Chest 2 View Result Date: 10/30/2023 EXAM: 2 VIEW(S) XRAY OF THE CHEST 10/30/2023 11:16:42 PM COMPARISON: 10/29/2023 CLINICAL HISTORY: shob. Patient from home with c/o shortness of breath since Wednesday. Was seen by PCP and started on albuterol  and she noted some minimal improvement. Patient does peritoneal dialysis at home every night. States sometimes she feels more shortness of breath if she hasn't had enough fluid taken off. Reports feeling like she could hear herself wheezing while laying there. EMS states her RA O2 sat was 89% with the fire dept 2 L of  O2 with [place] and improvement of Spo2 to 97%. FINDINGS: LUNGS AND PLEURA: Bibasilar atelectasis or infiltrates. The lungs are otherwise clear. No pleural effusion. No pneumothorax. HEART AND MEDIASTINUM: Stable cardiomediastinal silhouette. BONES AND SOFT TISSUES: No acute osseous abnormality. IMPRESSION: 1. Bibasilar atelectasis or infiltrates. Electronically signed by: Norman Gatlin MD 10/30/2023 11:22 PM EDT RP Workstation: [id number]     Medications:    dialysis solution 1.5% low-MG/low-CA     dialysis solution 2.5% low-MG/low-CA      [START ON 11/01/2023] atorvastatin   20 mg Oral Daily   carvedilol   25 mg Oral BID WC   escitalopram   10 mg Oral Daily    gentamicin  cream  1 Application Topical Daily   heparin   5,000 Units Subcutaneous Q8H   insulin  aspart  0-5 Units Subcutaneous QHS   insulin  aspart  0-9 Units Subcutaneous TID WC   insulin  glargine-yfgn  15 Units Subcutaneous Daily   ipratropium-albuterol   3 mL Nebulization Q6H   irbesartan   150 mg Oral Daily   spironolactone   25 mg Oral Daily   torsemide   100 mg Oral Daily   acetaminophen  **OR** acetaminophen , albuterol , hydrALAZINE , ondansetron  **OR** ondansetron  (ZOFRAN ) IV  Assessment/ Plan:  Ms. Helen Gray is a 73 y.o.  female with past medical history of ESRD on peritoneal diaysis, type 2 diabetes, with diabetic retinopathy, diabetic neuropathy, hypertension presented to the emergency room with 3-week history of wheezing and shortness of breath, unresolved with medication. She is known to our practice as a PD patient at [facility] and is followed by Dr. Dennise.  We have been notified to manage her dialysis needs.  #1.  Acute on chronic dyspnea Patient given DuoNebs in the ED. Currently on 2 liter nasal cannula. CT on 10/31/2023 Mild left upper lobe volume loss with bronchovascular consolidation/scarring posteriorly. Moderate irregular stenosis of the left upper lobe bronchus and mild irregular stenosis of the left lower lobe bronchus.Pulmonary nodule in the left lower lobe, measuring 9 x 8 x 9 mm. Patchy and reticular opacities in the periphery of the right lung   #2 ESRD on peritoneal dialysis Patient treatment time 9 hours 5 cycles, 2300 fill volume. Patient reports using combination 1.5%/2.5% dextrose  with treatment.   #3 POA hypertensive urgency Per chart, BP over 200 at arrival.  Patient prescribed carvedilol , hydralazine , irbesartan , spironolactone , torsemide .  BP currently 165/77  #4 anemia on chronic kidney disease HgB 9.8      LOS: 0 Steffani Dionisio P Roben Tatsch 8/2/20253:01 PM

## 2023-11-02 LAB — HEMOGLOBIN A1C
Hgb A1c MFr Bld: 8.8 % — ABNORMAL HIGH (ref 4.8–5.6)
Mean Plasma Glucose: 206 mg/dL

## 2023-11-06 ENCOUNTER — Other Ambulatory Visit: Payer: Self-pay | Admitting: Family Medicine

## 2023-11-06 DIAGNOSIS — E042 Nontoxic multinodular goiter: Secondary | ICD-10-CM

## 2023-11-13 ENCOUNTER — Ambulatory Visit
Admission: RE | Admit: 2023-11-13 | Discharge: 2023-11-13 | Disposition: A | Discharge status: Home / Self Care | Source: Ambulatory Visit | Attending: Family Medicine | Admitting: Family Medicine

## 2023-11-13 DIAGNOSIS — E042 Nontoxic multinodular goiter: Secondary | ICD-10-CM | POA: Diagnosis present

## 2023-12-02 ENCOUNTER — Other Ambulatory Visit: Payer: Self-pay | Admitting: Otolaryngology

## 2023-12-02 DIAGNOSIS — E041 Nontoxic single thyroid nodule: Secondary | ICD-10-CM

## 2023-12-25 ENCOUNTER — Ambulatory Visit
Admission: RE | Admit: 2023-12-25 | Discharge: 2023-12-25 | Disposition: A | Discharge status: Home / Self Care | Source: Ambulatory Visit | Attending: Otolaryngology | Admitting: Otolaryngology

## 2023-12-25 VITALS — BP 199/84 | HR 63

## 2023-12-25 DIAGNOSIS — E041 Nontoxic single thyroid nodule: Secondary | ICD-10-CM

## 2023-12-25 DIAGNOSIS — E042 Nontoxic multinodular goiter: Secondary | ICD-10-CM | POA: Diagnosis present

## 2023-12-25 MED ORDER — LIDOCAINE HCL (PF) 1 % IJ SOLN
10.0000 mL | Freq: Once | INTRAMUSCULAR | Status: AC
  Administered 2023-12-25: 10 mL via INTRADERMAL
  Filled 2023-12-25: qty 10

## 2023-12-28 LAB — CYTOLOGY - NON PAP

## 2024-02-11 ENCOUNTER — Other Ambulatory Visit: Payer: Self-pay | Admitting: Specialist

## 2024-02-11 DIAGNOSIS — R911 Solitary pulmonary nodule: Secondary | ICD-10-CM

## 2024-02-17 ENCOUNTER — Ambulatory Visit
Admission: RE | Admit: 2024-02-17 | Discharge: 2024-02-17 | Disposition: A | Discharge status: Home / Self Care | Source: Ambulatory Visit | Attending: Specialist | Admitting: Specialist

## 2024-02-17 DIAGNOSIS — R911 Solitary pulmonary nodule: Secondary | ICD-10-CM | POA: Diagnosis present
